# Patient Record
Sex: Female | Born: 1945 | Race: White | Hispanic: No | Smoking: Former smoker
Health system: Southern US, Community
[De-identification: ages and names within clinical notes are randomized; demographics above are authoritative.]

## PROBLEM LIST (undated history)

## (undated) DIAGNOSIS — T8859XA Other complications of anesthesia, initial encounter: Secondary | ICD-10-CM

## (undated) DIAGNOSIS — Z87442 Personal history of urinary calculi: Secondary | ICD-10-CM

## (undated) DIAGNOSIS — G500 Trigeminal neuralgia: Secondary | ICD-10-CM

## (undated) DIAGNOSIS — K219 Gastro-esophageal reflux disease without esophagitis: Secondary | ICD-10-CM

## (undated) DIAGNOSIS — T4145XA Adverse effect of unspecified anesthetic, initial encounter: Secondary | ICD-10-CM

## (undated) DIAGNOSIS — I10 Essential (primary) hypertension: Secondary | ICD-10-CM

## (undated) DIAGNOSIS — J45909 Unspecified asthma, uncomplicated: Secondary | ICD-10-CM

## (undated) DIAGNOSIS — M1990 Unspecified osteoarthritis, unspecified site: Secondary | ICD-10-CM

## (undated) DIAGNOSIS — IMO0001 Reserved for inherently not codable concepts without codable children: Secondary | ICD-10-CM

## (undated) DIAGNOSIS — E119 Type 2 diabetes mellitus without complications: Secondary | ICD-10-CM

## (undated) DIAGNOSIS — F32A Depression, unspecified: Secondary | ICD-10-CM

## (undated) DIAGNOSIS — E785 Hyperlipidemia, unspecified: Secondary | ICD-10-CM

## (undated) DIAGNOSIS — F410 Panic disorder [episodic paroxysmal anxiety] without agoraphobia: Secondary | ICD-10-CM

## (undated) DIAGNOSIS — F329 Major depressive disorder, single episode, unspecified: Secondary | ICD-10-CM

## (undated) HISTORY — DX: Hyperlipidemia, unspecified: E78.5

## (undated) HISTORY — DX: Essential (primary) hypertension: I10

## (undated) HISTORY — PX: TUBAL LIGATION: SHX77

## (undated) HISTORY — PX: APPENDECTOMY: SHX54

## (undated) HISTORY — PX: TOTAL ABDOMINAL HYSTERECTOMY: SHX209

## (undated) HISTORY — PX: BACK SURGERY: SHX140

## (undated) HISTORY — PX: CHOLECYSTECTOMY: SHX55

## (undated) HISTORY — PX: CYSTOSCOPY W/ STONE MANIPULATION: SHX1427

## (undated) HISTORY — DX: Unspecified asthma, uncomplicated: J45.909

---

## 1998-07-04 ENCOUNTER — Emergency Department (HOSPITAL_COMMUNITY): Admission: EM | Admit: 1998-07-04 | Discharge: 1998-07-04 | Payer: Self-pay

## 1998-08-09 ENCOUNTER — Other Ambulatory Visit: Admission: RE | Admit: 1998-08-09 | Discharge: 1998-08-09 | Payer: Self-pay | Admitting: Obstetrics and Gynecology

## 1999-08-03 ENCOUNTER — Other Ambulatory Visit: Admission: RE | Admit: 1999-08-03 | Discharge: 1999-08-03 | Payer: Self-pay | Admitting: Family Medicine

## 1999-12-08 ENCOUNTER — Ambulatory Visit (HOSPITAL_COMMUNITY): Admission: RE | Admit: 1999-12-08 | Discharge: 1999-12-08 | Payer: Self-pay

## 1999-12-25 ENCOUNTER — Encounter: Admission: RE | Admit: 1999-12-25 | Discharge: 1999-12-25 | Payer: Self-pay | Admitting: Family Medicine

## 1999-12-25 ENCOUNTER — Encounter: Payer: Self-pay | Admitting: Family Medicine

## 2001-08-19 ENCOUNTER — Encounter: Admission: RE | Admit: 2001-08-19 | Discharge: 2001-08-19 | Payer: Self-pay | Admitting: Family Medicine

## 2001-08-19 ENCOUNTER — Encounter: Payer: Self-pay | Admitting: Family Medicine

## 2001-08-21 ENCOUNTER — Encounter: Payer: Self-pay | Admitting: Family Medicine

## 2001-08-21 ENCOUNTER — Encounter: Admission: RE | Admit: 2001-08-21 | Discharge: 2001-08-21 | Payer: Self-pay | Admitting: Family Medicine

## 2001-09-01 ENCOUNTER — Encounter (INDEPENDENT_AMBULATORY_CARE_PROVIDER_SITE_OTHER): Payer: Self-pay

## 2001-09-01 ENCOUNTER — Observation Stay (HOSPITAL_COMMUNITY): Admission: RE | Admit: 2001-09-01 | Discharge: 2001-09-02 | Payer: Self-pay

## 2002-08-17 ENCOUNTER — Encounter: Payer: Self-pay | Admitting: Urology

## 2002-08-17 ENCOUNTER — Encounter: Admission: RE | Admit: 2002-08-17 | Discharge: 2002-08-17 | Payer: Self-pay | Admitting: Urology

## 2002-08-19 ENCOUNTER — Ambulatory Visit (HOSPITAL_BASED_OUTPATIENT_CLINIC_OR_DEPARTMENT_OTHER): Admission: RE | Admit: 2002-08-19 | Discharge: 2002-08-19 | Payer: Self-pay | Admitting: Urology

## 2002-08-23 ENCOUNTER — Encounter: Payer: Self-pay | Admitting: Family Medicine

## 2002-08-23 ENCOUNTER — Encounter: Admission: RE | Admit: 2002-08-23 | Discharge: 2002-08-23 | Payer: Self-pay | Admitting: Family Medicine

## 2003-09-05 ENCOUNTER — Ambulatory Visit (HOSPITAL_COMMUNITY): Admission: RE | Admit: 2003-09-05 | Discharge: 2003-09-05 | Payer: Self-pay | Admitting: Family Medicine

## 2003-09-05 ENCOUNTER — Encounter: Payer: Self-pay | Admitting: Family Medicine

## 2003-09-06 ENCOUNTER — Ambulatory Visit (HOSPITAL_COMMUNITY): Admission: RE | Admit: 2003-09-06 | Discharge: 2003-09-06 | Payer: Self-pay | Admitting: Family Medicine

## 2003-09-21 ENCOUNTER — Encounter: Admission: RE | Admit: 2003-09-21 | Discharge: 2003-09-21 | Payer: Self-pay | Admitting: Family Medicine

## 2003-11-07 ENCOUNTER — Ambulatory Visit (HOSPITAL_COMMUNITY): Admission: RE | Admit: 2003-11-07 | Discharge: 2003-11-07 | Payer: Self-pay | Admitting: *Deleted

## 2004-04-17 ENCOUNTER — Other Ambulatory Visit: Admission: RE | Admit: 2004-04-17 | Discharge: 2004-04-17 | Payer: Self-pay | Admitting: *Deleted

## 2004-10-23 ENCOUNTER — Encounter: Admission: RE | Admit: 2004-10-23 | Discharge: 2004-10-23 | Payer: Self-pay | Admitting: Family Medicine

## 2005-09-16 ENCOUNTER — Encounter: Admission: RE | Admit: 2005-09-16 | Discharge: 2005-09-16 | Payer: Self-pay | Admitting: Family Medicine

## 2006-02-07 ENCOUNTER — Encounter: Admission: RE | Admit: 2006-02-07 | Discharge: 2006-02-07 | Payer: Self-pay | Admitting: Orthopedic Surgery

## 2006-10-09 ENCOUNTER — Encounter: Admission: RE | Admit: 2006-10-09 | Discharge: 2006-10-09 | Payer: Self-pay | Admitting: Family Medicine

## 2006-10-17 ENCOUNTER — Encounter: Admission: RE | Admit: 2006-10-17 | Discharge: 2006-10-17 | Payer: Self-pay | Admitting: Family Medicine

## 2007-01-26 ENCOUNTER — Other Ambulatory Visit: Admission: RE | Admit: 2007-01-26 | Discharge: 2007-01-26 | Payer: Self-pay | Admitting: Family Medicine

## 2007-03-17 ENCOUNTER — Ambulatory Visit (HOSPITAL_COMMUNITY): Admission: RE | Admit: 2007-03-17 | Discharge: 2007-03-17 | Payer: Self-pay | Admitting: Interventional Cardiology

## 2007-10-22 ENCOUNTER — Ambulatory Visit (HOSPITAL_COMMUNITY): Admission: RE | Admit: 2007-10-22 | Discharge: 2007-10-22 | Payer: Self-pay | Admitting: Gastroenterology

## 2007-10-22 ENCOUNTER — Encounter (INDEPENDENT_AMBULATORY_CARE_PROVIDER_SITE_OTHER): Payer: Self-pay | Admitting: Gastroenterology

## 2007-11-12 LAB — HM COLONOSCOPY: HM Colonoscopy: NORMAL

## 2007-11-18 ENCOUNTER — Ambulatory Visit (HOSPITAL_COMMUNITY): Admission: RE | Admit: 2007-11-18 | Discharge: 2007-11-18 | Payer: Self-pay | Admitting: Gastroenterology

## 2008-04-21 ENCOUNTER — Encounter: Admission: RE | Admit: 2008-04-21 | Discharge: 2008-04-21 | Payer: Self-pay | Admitting: Family Medicine

## 2009-06-12 ENCOUNTER — Encounter: Admission: RE | Admit: 2009-06-12 | Discharge: 2009-06-12 | Payer: Self-pay | Admitting: Family Medicine

## 2009-09-30 ENCOUNTER — Ambulatory Visit: Payer: Self-pay | Admitting: Family Medicine

## 2009-09-30 DIAGNOSIS — R569 Unspecified convulsions: Secondary | ICD-10-CM

## 2009-09-30 DIAGNOSIS — I10 Essential (primary) hypertension: Secondary | ICD-10-CM | POA: Insufficient documentation

## 2010-02-09 LAB — HM MAMMOGRAPHY: HM Mammogram: NORMAL

## 2010-05-08 ENCOUNTER — Ambulatory Visit: Payer: Self-pay | Admitting: Family Medicine

## 2010-05-08 LAB — CONVERTED CEMR LAB
Glucose, Urine, Semiquant: 100
Nitrite: POSITIVE
Protein, U semiquant: 100
Specific Gravity, Urine: 1.02
Urobilinogen, UA: 4
pH: 5.5

## 2010-05-09 ENCOUNTER — Encounter: Payer: Self-pay | Admitting: Family Medicine

## 2010-05-28 ENCOUNTER — Other Ambulatory Visit: Admission: RE | Admit: 2010-05-28 | Discharge: 2010-05-28 | Payer: Self-pay | Admitting: Family Medicine

## 2010-06-19 ENCOUNTER — Encounter: Admission: RE | Admit: 2010-06-19 | Discharge: 2010-06-19 | Payer: Self-pay | Admitting: Family Medicine

## 2010-07-03 ENCOUNTER — Encounter: Admission: RE | Admit: 2010-07-03 | Discharge: 2010-07-03 | Payer: Self-pay | Admitting: Family Medicine

## 2010-12-01 ENCOUNTER — Encounter: Payer: Self-pay | Admitting: Orthopedic Surgery

## 2010-12-03 ENCOUNTER — Encounter: Payer: Self-pay | Admitting: Family Medicine

## 2010-12-11 NOTE — Assessment & Plan Note (Signed)
Summary: Traci Sullivan   Vital Signs:  Patient Profile:   65 Years Old Female CC:      dysuria, urinary frequency  X 3 days, rm 3 Height:     64.5 inches Weight:      268 pounds O2 Sat:      96 % O2 treatment:    Room Air Temp:     97.1 degrees F oral Pulse rate:   80 / minute Resp:     18 per minute BP sitting:   136 / 76  (right arm) Cuff size:   regular  Pt. in pain?   no  Vitals Entered By: Lajean Saver RN (May 08, 2010 10:37 AM)                   Updated Prior Medication List: CRESTOR 5 MG TABS (ROSUVASTATIN CALCIUM) 1 tab by mouth once daily TEGRETOL 200 MG TABS (CARBAMAZEPINE) 1-3 times daily ACTOS 30 MG TABS (PIOGLITAZONE HCL) 1 tab by mouth once daily COZAAR 25 MG TABS (LOSARTAN POTASSIUM) 1 tab by mouth once daily ESTROPIPATE 1.5 MG TABS (ESTROPIPATE) 1 tab po once daily METFORMIN HCL 500 MG TABS (METFORMIN HCL) 1 tab by mouth two times a day ASPIR-LOW 81 MG TBEC (ASPIRIN) 1 tab by mouth once daily VITAMIN D 1000 UNIT TABS (CHOLECALCIFEROL) 1 tab by mouth once daily VITAMIN B-12 1000 MCG TABS (CYANOCOBALAMIN) 1 tab by mouth once daily FISH OIL 1000 MG CAPS (OMEGA-3 FATTY ACIDS) 1 tab by mouth once daily * FIORICET 1 by mouth q6-8hrs as needed * ALLEGRA- D 24HRS SIG 1 by mouth QDAY AZO-STANDARD 95 MG TABS (PHENAZOPYRIDINE HCL) two times a day since 05-07-10  Current Allergies (reviewed today): ! PENICILLINHistory of Present Illness Chief Complaint: dysuria, urinary frequency  X 3 days, rm 3 History of Present Illness: Subjective:  Patient presents complaining of UTI symptoms for 2 days.  Complains of dysuria, frequency, nocturia, and urgency.  No hematuria.  No abnormal vaginal discharge.  No fever/chills/sweats.  No abdominal pain.  No flank pain.  No nausea/vomiting.   Last UTI about a year ago.  Usually responds well to Cipro.  REVIEW OF SYSTEMS Constitutional Symptoms      Denies fever, chills, night sweats, weight loss, weight gain, and fatigue.    Eyes       Denies change in vision, eye pain, eye discharge, glasses, contact lenses, and eye surgery. Ear/Nose/Throat/Mouth       Denies hearing loss/aids, change in hearing, ear pain, ear discharge, dizziness, frequent runny nose, frequent nose bleeds, sinus problems, sore throat, hoarseness, and tooth pain or bleeding.  Respiratory       Denies dry cough, productive cough, wheezing, shortness of breath, asthma, bronchitis, and emphysema/COPD.  Cardiovascular       Denies murmurs, chest pain, and tires easily with exhertion.    Gastrointestinal       Denies stomach pain, nausea/vomiting, diarrhea, constipation, blood in bowel movements, and indigestion. Genitourniary       Complains of painful urination.      Denies kidney stones and loss of urinary control.      Comments: urinary frequency Neurological       Denies paralysis, seizures, and fainting/blackouts. Musculoskeletal       Denies muscle pain, joint pain, joint stiffness, decreased range of motion, redness, swelling, muscle weakness, and gout.  Skin       Denies bruising, unusual mles/lumps or sores, and hair/skin or nail changes.  Psych  Denies mood changes, temper/anger issues, anxiety/stress, speech problems, depression, and sleep problems.  Past History:  Past Medical History: Reviewed history from 09/30/2009 and no changes required. Diabetes mellitus, type I Hyperlipidemia Hypertension Seizure disorder  Past Surgical History: Reviewed history from 09/30/2009 and no changes required. Appendectomy Caesarean section Cholecystectomy Tubal ligation  Family History: Reviewed history from 09/30/2009 and no changes required. Family History Diabetes 1st degree relative Family History Hypertension Family History of Prostate CA 1st degree relative <50  Social History: Reviewed history from 09/30/2009 and no changes required. Married Never Smoked Alcohol use-no Drug use-no Regular  exercise-no   Objective:  Appearance:   Obese middle-aged female alert and in no acute distress  Eyes:  Pupils are equal, round, and reactive to light and accomdation.  Extraocular movement is intact.  Conjunctivae are not inflamed.  Mouth:  moist mucous membranes  Neck:  Supple.  No adenopathy is present.   Lungs:  Clear to auscultation.  Breath sounds are equal.  Heart:  Regular rate and rhythm without murmurs, rubs, or gallops.  Abdomen:  Nontender without masses or hepatosplenomegaly.  Bowel sounds are present.  No CVA or flank tenderness.  urinalysis (dipstick):  large blood, positive nitrite, large leuks Assessment New Problems: ACUTE CYSTITIS (ICD-595.0)   Plan New Medications/Changes: CIPROFLOXACIN HCL 250 MG TABS (CIPROFLOXACIN HCL) 1 po two times a day  #14 x 0, 05/08/2010, Donna Christen MD  New Orders: T-Culture, Urine [[id number]] Est. Patient Level III [16010] Urinalysis [81003-65000] T-Culture, Urine [[id number]] Planning Comments:   Urine culture pending. Begin Cipro for 5 to 7 days.  Increase fluids. Follow-up with PCP if not improving.   The patient and/or caregiver has been counseled thoroughly with regard to medications prescribed including dosage, schedule, interactions, rationale for use, and Traci side effects and they verbalize understanding.  Diagnoses and expected course of recovery discussed and will return if not improved as expected or if the condition worsens. Patient and/or caregiver verbalized understanding.  Prescriptions: CIPROFLOXACIN HCL 250 MG TABS (CIPROFLOXACIN HCL) 1 po two times a day  #14 x 0   Entered and Authorized by:   Donna Christen MD   Signed by:   Donna Christen MD on 05/08/2010   Method used:   Print then Give to Patient   RxID:   [id number]   Orders Added: 1)  T-Culture, Urine [[id number]] 2)  Est. Patient Level III [10626] 3)  Urinalysis [81003-65000] 4)  T-Culture, Urine [[id number]]  Laboratory Results    Urine Tests  Date/Time Received: May 08, 2010 10:52 AM  Date/Time Reported: May 08, 2010 10:52 AM   Routine Urinalysis   Color: orange Appearance: slightly cloudy Glucose: 100   (Normal Range: Negative) Bilirubin: moderate   (Normal Range: Negative) Ketone: small (15)   (Normal Range: Negative) Spec. Gravity: 1.020   (Normal Range: 1.003-1.035) Blood: large   (Normal Range: Negative) pH: 5.5   (Normal Range: 5.0-8.0) Protein: 100   (Normal Range: Negative) Urobilinogen: 4.0   (Normal Range: 0-1) Nitrite: positive   (Normal Range: Negative) Leukocyte Esterace: large   (Normal Range: Negative)

## 2011-01-18 ENCOUNTER — Ambulatory Visit (INDEPENDENT_AMBULATORY_CARE_PROVIDER_SITE_OTHER): Payer: Medicare Other | Admitting: Family Medicine

## 2011-01-18 ENCOUNTER — Encounter: Payer: Self-pay | Admitting: Family Medicine

## 2011-01-18 DIAGNOSIS — E119 Type 2 diabetes mellitus without complications: Secondary | ICD-10-CM | POA: Insufficient documentation

## 2011-01-18 DIAGNOSIS — R5381 Other malaise: Secondary | ICD-10-CM

## 2011-01-18 DIAGNOSIS — N951 Menopausal and female climacteric states: Secondary | ICD-10-CM

## 2011-01-18 DIAGNOSIS — N760 Acute vaginitis: Secondary | ICD-10-CM | POA: Insufficient documentation

## 2011-01-18 DIAGNOSIS — R5383 Other fatigue: Secondary | ICD-10-CM

## 2011-01-21 ENCOUNTER — Encounter: Payer: Self-pay | Admitting: Family Medicine

## 2011-01-21 LAB — CONVERTED CEMR LAB
Clue Cells Wet Prep HPF POC: NONE SEEN
Trich, Wet Prep: NONE SEEN
WBC, Wet Prep HPF POC: NONE SEEN
Yeast Wet Prep HPF POC: NONE SEEN

## 2011-01-22 NOTE — Assessment & Plan Note (Addendum)
Summary: NOV: Get estab    Vital Signs:  Patient profile:   65 year old female Height:      64.5 inches Weight:      276 pounds BMI:     46.81 O2 Sat:      72 % on Room air Pulse rate:   72 / minute BP sitting:   131 / 71  (left arm) Cuff size:   large  Vitals Entered By: Payton Spark CMA (January 18, 2011 9:11 AM)  O2 Flow:  Room air CC: New to est.    CC:  New to est. .  History of Present Illness: Havng a vagainal odor but no discharge.  No dysuria.   Habits & Providers  Alcohol-Tobacco-Diet     Tobacco Status: quit  Current Medications (verified): 1)  Crestor 5 Mg Tabs (Rosuvastatin Calcium) .Marland Kitchen.. 1 Tab By Mouth Once Daily 2)  Tegretol 200 Mg Tabs (Carbamazepine) .Marland Kitchen.. 1-3 Times Daily 3)  Actos 30 Mg Tabs (Pioglitazone Hcl) .Marland Kitchen.. 1 Tab By Mouth Once Daily 4)  Cozaar 25 Mg Tabs (Losartan Potassium) .Marland Kitchen.. 1 Tab By Mouth Once Daily 5)  Estropipate 1.5 Mg Tabs (Estropipate) .Marland Kitchen.. 1 Tab Po Once Daily 6)  Aspir-Low 81 Mg Tbec (Aspirin) .Marland Kitchen.. 1 Tab By Mouth Once Daily 7)  Vitamin D 1000 Unit Tabs (Cholecalciferol) .Marland Kitchen.. 1 Tab By Mouth Once Daily 8)  Vitamin B-12 1000 Mcg Tabs (Cyanocobalamin) .Marland Kitchen.. 1 Tab By Mouth Once Daily 9)  Fish Oil 1000 Mg Caps (Omega-3 Fatty Acids) .Marland Kitchen.. 1 Tab By Mouth Once Daily 10)  Miralax  Powd (Polyethylene Glycol 3350) .... Take As Directed 3 Times Weekly  Allergies (verified): 1)  ! Penicillin  Past History:  Past Surgical History: Appendectomy Caesarean section Cholecystectomy Tubal ligation Complete Hysterectomy kidney stones removed.   Family History: Family History Diabetes 1st degree relative Family History Hypertension Family History of Prostate CA 1st degree relative <50  Younger Sister the BreastCancer(dx in 82s),  and older sister dx colon Cancer at age 30, DM, hi chol Mom with MI, HTN Father with stroke, kidney dz Aunts with stroke.  GF with MI  Social History: Married to [place] with 2 adult kids. Retiredx.  HS degree.    Alcohol use-no Drug use-no Regular exercise-no Former Smoker Smoking Status:  quit  Review of Systems       + fever/sweats/weakness, unexplained weight loss/gain.  No vison changes.  No difficulty hearing/ringing in ears, hay fever/allergies. +chest pain/discomfort, no palpitations.  No Br lump/nipple discharge.  No cough/wheeze.  + blood in BM, no nausea/vomiting/diarrhea.  + nighttime urination, no leaking urine, unusual vaginal bleeding, discharge (penis or vagina).  + muscle/joint pain. + rash, change in mole.  No HA, memory loss.  No anxiety, sleep d/o, depression.  + easy bruising/bleeding, no unexplained lump   Physical Exam  General:  Well-developed,well-nourished,in no acute distress; alert,appropriate and cooperative throughout examination Neck:  No deformities, masses, or tenderness noted. NO Tm.  Lungs:  Normal respiratory effort, chest expands symmetrically. Lungs are clear to auscultation, no crackles or wheezes. Heart:  Normal rate and regular rhythm. S1 and S2 normal without gallop, murmur, click, rub or other extra sounds. Skin:  no rashes.   Psych:  Cognition and judgment appear intact. Alert and cooperative with normal attention span and concentration. No apparent delusions, illusions, hallucinations   Impression & Recommendations:  Problem # 1:  DIABETES MELLITUS, CONTROLLED (ICD-250.00) Will d/c Actos and change to onglyza. Will f/u in one month  to see how she is doing on this.  I discussed the potential risk with the Actos.  The following medications were removed from the medication list:    Metformin Hcl 500 Mg Tabs (Metformin hcl) .Marland Kitchen... 1 tab by mouth two times a day Her updated medication list for this problem includes:    Onglyza 5 Mg Tabs (Saxagliptin hcl) .Marland Kitchen... Take 1 tablet by mouth once a day    Cozaar 25 Mg Tabs (Losartan potassium) .Marland Kitchen... 1 tab by mouth once daily    Aspir-low 81 Mg Tbec (Aspirin) .Marland Kitchen... 1 tab by mouth once daily  Problem # 2:  SEIZURE  DISORDER (ICD-780.39) Well controlled. Allowed to drive.  Her updated medication list for this problem includes:    Tegretol 200 Mg Tabs (Carbamazepine) .Marland Kitchen... 1-3 times daily  Problem # 3:  MENOPAUSAL SYNDROME (ICD-627.2) Try to wean off yearly.Has been on it for about 5 years.   Her updated medication list for this problem includes:    Estropipate 1.5 Mg Tabs (Estropipate) .Marland Kitchen... 1 tab po once daily  Problem # 4:  FATIGUE (ICD-780.79) New onset fatigue the last coupel of months. Will check blood coutn and thyroid. WIll also recheck iron. Hx of iron def anemia.  Orders: T-CBC w/Diff ([id number]) T-TSH [phone]) Augusto Gamble ([id number])  Complete Medication List: 1)  Crestor 5 Mg Tabs (Rosuvastatin calcium) .Marland Kitchen.. 1 tab by mouth once daily 2)  Tegretol 200 Mg Tabs (Carbamazepine) .Marland Kitchen.. 1-3 times daily 3)  Onglyza 5 Mg Tabs (Saxagliptin hcl) .... Take 1 tablet by mouth once a day 4)  Cozaar 25 Mg Tabs (Losartan potassium) .Marland Kitchen.. 1 tab by mouth once daily 5)  Estropipate 1.5 Mg Tabs (Estropipate) .Marland Kitchen.. 1 tab po once daily 6)  Aspir-low 81 Mg Tbec (Aspirin) .Marland Kitchen.. 1 tab by mouth once daily 7)  Vitamin D 1000 Unit Tabs (Cholecalciferol) .Marland Kitchen.. 1 tab by mouth once daily 8)  Vitamin B-12 1000 Mcg Tabs (Cyanocobalamin) .Marland Kitchen.. 1 tab by mouth once daily 9)  Fish Oil 1000 Mg Caps (Omega-3 fatty acids) .Marland Kitchen.. 1 tab by mouth once daily 10)  Miralax Powd (Polyethylene glycol 3350) .... Take as directed 3 times weekly  Patient Instructions: 1)  Remember to get your eye exam.  2)  Please schedule a follow-up appointment in 1 month for diabetes. Marland Kitchen  3)  We will call you with your lab results.  Prescriptions: ONGLYZA 5 MG TABS (SAXAGLIPTIN HCL) Take 1 tablet by mouth once a day  #30 x 2   Entered and Authorized by:   Nani Gasser MD   Signed by:   Nani Gasser MD on 01/18/2011   Method used:   Electronically to        [facility]* (retail)       [address] [place], [place]   [address]       Ph: [phone]       Fax: [phone]   RxID:   [id number]    Orders Added: 1)  T-CBC w/Diff [[id number]] 2)  T-TSH [[id number]] 3)  T-Iron [32992-42683] 4)  New Patient Level III [41962]     Preventive Care Screening  Bone Density:    Date:  09/11/2010    Next Due:  09/2012    Results:  abnormal std dev  Pap Smear:    Next Due:  01/2012  Mammogram:    Date:  02/09/2010    Next Due:  02/2011    Results:  normal  Colonoscopy:    Date:  11/12/2007    Next Due:  11/2012    Results:  normal    Prevention & Chronic Care Immunizations   Influenza vaccine: Not documented    Tetanus booster: Not documented    Pneumococcal vaccine: Not documented    H. zoster vaccine: Not documented  Colorectal Screening   Hemoccult: Not documented    Colonoscopy: normal  (11/12/2007)   Colonoscopy due: 11/2012  Other Screening   Pap smear: Not documented   Pap smear due: 01/2012    Mammogram: normal  (02/09/2010)   Mammogram due: 02/2011    DXA bone density scan: abnormal  (09/11/2010)   DXA scan due: 09/2012    Smoking status: quit  (01/18/2011)  Diabetes Mellitus   HgbA1C: Not documented    Eye exam: Not documented    Foot exam: Not documented   High risk foot: Not documented   Foot care education: Not documented    Urine microalbumin/creatinine ratio: Not documented  Lipids   Total Cholesterol: Not documented   LDL: Not documented   LDL Direct: Not documented   HDL: Not documented   Triglycerides: Not documented  Hypertension   Last Blood Pressure: 131 / 71  (01/18/2011)   Serum creatinine: Not documented   Serum potassium Not documented  Self-Management Support :    Diabetes self-management support: Not documented    Hypertension self-management support: Not documented    Appended Document: NOV: Get estab  Will send wet prep for vaginitis.  Vaginal irritation and odor. No discharge.   Appended Document: NOV: Get estab   Call pt: She can stop her fosamax and take a "HOliday" from it for about 5 years but we need to make sure we are getting her bone density tests regularly to make sure not a significant decline in bone density once of the medication. There are not clear cut guidelines on this but this is some data to suggest that once on it for 5 + years it is protective for a few years after come off the medication. March  9, 20125:09 PM Caroleann Casler MD, Santina Evans   .called pt and pt states she is not on fosamax

## 2011-01-29 NOTE — Letter (Signed)
Summary: Intake Forms  Intake Forms   Imported By: Lanelle Bal 01/24/2011 10:43:26  _____________________________________________________________________  External Attachment:    Type:   Image     Comment:   External Document

## 2011-02-05 ENCOUNTER — Telehealth: Payer: Self-pay | Admitting: Family Medicine

## 2011-02-05 NOTE — Telephone Encounter (Signed)
Yes, I can lance the cyst.

## 2011-02-06 NOTE — Telephone Encounter (Signed)
Pt has been notified that this can be done

## 2011-02-12 ENCOUNTER — Encounter: Payer: Self-pay | Admitting: Family Medicine

## 2011-02-18 ENCOUNTER — Ambulatory Visit: Payer: Medicare Other | Admitting: Family Medicine

## 2011-03-26 NOTE — Op Note (Signed)
Traci Sullivan, Traci Sullivan                ACCOUNT NO.:  [id number]   MEDICAL RECORD NO.:  [id number]          PATIENT TYPE:  AMB   LOCATION:  ENDO                         [facility]AC[facility]   PHYSICIAN:  Shirley Friar, MDDATE OF BIRTH:  February 09, 1946   DATE OF PROCEDURE:  DATE OF DISCHARGE:                               OPERATIVE REPORT   INDICATIONS:  Iron-deficiency anemia.   MEDICATIONS:  See anesthesia sheet (total is given preceding  colonoscopy).   FINDINGS:  The endoscope was inserted through the oropharynx, and  esophagus was intubated.  The esophagus was normal in its entirety.  The  endoscope was advanced to the stomach, which revealed two small  superficial ulcerations in the pre-pyloric channel with surrounding  edema.  Mild linear erythematous streaks were noted in the antrum,  esophageal erosions.  Two biopsies were taken in the region of these  ulcerations.  The remaining portion of the stomach and mucosa were  normal in appearance.  The endoscope was advanced into the duodenal bulb  and second portion of the duodenum, which were normal endoscopically.  Biopsies were taken in the second portion of the duodenum.   ASSESSMENT:  1. Gastric ulcerations.  2. Gastric erosions.  3. Otherwise normal esophagogastroduodenoscopy.   PLAN:  Follow up on pathology.      Shirley Friar, MD  Electronically Signed     VCS/MEDQ  D:  10/22/2007  T:  10/22/2007  Job:  [id number]   cc:   Carma Leaven, DO  Fax: [phone]

## 2011-03-26 NOTE — Op Note (Signed)
Traci Sullivan, Traci Sullivan                ACCOUNT NO.:  [id number]   MEDICAL RECORD NO.:  [id number]          PATIENT TYPE:  AMB   LOCATION:  ENDO                         FACILITY:  [facility]   PHYSICIAN:  Shirley Friar, MDDATE OF BIRTH:  1946/07/06   DATE OF PROCEDURE:  10/22/2007  DATE OF DISCHARGE:                               OPERATIVE REPORT   PROCEDURE:  Colonoscopy.   INDICATION:  Iron-deficiency anemia.   MEDICATIONS:  Fentanyl 50 mcg IV, Versed 2 mg the IV, propofol infusion  per anesthesia (total of 500 mg IV given for both procedures combined).   FINDINGS:  Rectal exam was normal.  A pediatric Pentax colonoscope was  inserted into a adequately prepped colon and advanced to the cecum.  In  order reach the cecum, the patient had to be turned in supine position  and abdominal pressure had to be applied.  With repeated loop reduction  and abdominal pressure and turning back in left lateral position the  endoscope was able to intubate the cecum.  Appendiceal orifice and the  ileocecal valve were identified.  On careful withdrawal of the  colonoscope no mucosal abnormalities were seen.  Retroflexion revealed a  hypertrophied anal papillae but otherwise unremarkable retroflexion.   ASSESSMENT:  Normal colonoscopy.   PLAN:  1. Proceed with upper endoscopy.  2. Repeat colonoscopy in 5 years due to family history of colon      cancer.      Shirley Friar, MD  Electronically Signed     VCS/MEDQ  D:  10/22/2007  T:  10/23/2007  Job:  [id number]   cc:   Carma Leaven, DO  Fax: [phone]

## 2011-03-26 NOTE — Op Note (Signed)
Traci Sullivan, Traci Sullivan                ACCOUNT NO.:  [id number]   MEDICAL RECORD NO.:  [id number]         PATIENT TYPE:  LAMB   LOCATION:                               FACILITY:  ENDO   PHYSICIAN:  Shirley Friar, MDDATE OF BIRTH:  Mar 14, 1946   DATE OF PROCEDURE:  11/18/2007  DATE OF DISCHARGE:                               OPERATIVE REPORT   PROCEDURE:  Capsule endoscopy.   INDICATIONS:  Iron-deficiency anemia.   FINDINGS:  First gastric image was noted at 27 seconds.  First duodenal  image was noted at 2 hours 57 minutes 2 seconds.  Small bowel transit  time was 3 hours 56 minutes.   A few non-bleeding small bowel erosions were seen.  No other  abnormalities were noted.   SUMMARY AND RECOMMENDATIONS:  1. A few small bowel erosions that I doubt are the source of her      severe iron-deficiency anemia.  2. Continue iron supplementation.  3. Hematology consult.      Shirley Friar, MD  Electronically Signed     VCS/MEDQ  D:  11/25/2007  T:  11/26/2007  Job:  [id number]   cc:   Sigmund Hazel, M.D.  Fax: [phone]

## 2011-03-29 NOTE — Op Note (Signed)
NAME:  Traci Sullivan, Traci Sullivan                          ACCOUNT NO.:  [id number]   MEDICAL RECORD NO.:  [id number]                   PATIENT TYPE:  AMB   LOCATION:  [facility]                                 FACILITY:  [facility]   PHYSICIAN:  Excell Seltzer. Annabell Howells, M.D.                 DATE OF BIRTH:  January 28, 1946   DATE OF PROCEDURE:  08/19/2002  DATE OF DISCHARGE:                                 OPERATIVE REPORT   PROCEDURE:  Cystoscopy, left retrograde pyelogram with interpretation, left  ureteroscopic stone extraction, insertion of left double J stent.   PREOPERATIVE DIAGNOSES:  Left UPJ stone.   POSTOPERATIVE DIAGNOSES:  Left UPJ stone.   SURGEON:  Excell Seltzer. Annabell Howells, M.D.   ANESTHESIA:  General.   DRAINS:  6 French 24 cm double J stent.   SPECIMEN:  Stone.   COMPLICATIONS:  None.   INDICATIONS FOR PROCEDURE:  Traci Sullivan is a 65 year old white female with a  six week history of hematuria and remote flank pain who was found on CT to  have a 2 x 5 mm left UPJ stone. It was difficult to see this stone on a  plain x-ray, so it was felt that ureteroscopy was the best treatment  approach.   FINDINGS AND PROCEDURE:  The patient had been on antibiotics preoperatively  and was given Tequin this morning. She was taken to the operating room where  a general anesthetic was induced. She was placed in the lithotomy position,  her perineum and genitalia were prepped with Betadine solution and she was  draped in the usual sterile fashion. Cystoscopy was performed using the 22  [place] scope and 12 and 70 degree lenses. Examination revealed a normal  urethra, the bladder wall was smooth and pale without tumor, stones or  inflammation, the ureteral orifices were unremarkable. The left ureteral  orifice was cannulated with a 5 [place] open end catheter, contrast was  instilled in a retrograde fashion. This study demonstrated an unremarkable  ureter to the level of the UPJ where there was a filling defect consistent  with a stone seen on CT scan. There was mild fullness of the collecting  system proximal to this stone. Initially an attempt was made to basket the  stone out using a __________ [name]; however, the stone was not  retrieved with two passes of the basket. I then passed a 6 [place] short  ureteroscope up to the level of the UVJ and no longer saw the stone and felt  it was likely bumped back into the kidney. I then passed a guidewire through  the 6 [place] short ureteral scope, remove the short ureteroscope and then  placed a 6 French flexible ureteroscope over the wire to the kidney.  Inspection demonstrated the stone had dislodged into the lower pole of the  kidney where it was engaged with a nitinol basket and then removed without  difficulty.  After removal of the stone, the cystoscope was replaced, a  guidewire was inserted into the kidney and a 6 French 24 cm double J stent  was placed without difficulty over the wire to the kidney under fluoroscopic  guidance. On removal of the wire, a good coil was noted in the kidney and  the bladder. The bladder was then drained, the cystoscope was removed and  the stent string was left exiting the urethra. The position of the stent was  confirmed with fluoroscopy. The stent string was tied short and trimmed. The  patient was taken down from lithotomy position, her anesthetic was reversed  and she was removed to the recovery room in stable condition. There were no  complications.                                               Excell Seltzer. Annabell Howells, M.D.    JJW/MEDQ  D:  08/19/2002  T:  08/19/2002  Job:  [id number]   cc:   Clovis Riley, M.D.

## 2011-03-29 NOTE — Cardiovascular Report (Signed)
NAMELARONDA, Traci Sullivan                ACCOUNT NO.:  [id number]   MEDICAL RECORD NO.:  [id number]          PATIENT TYPE:  OIB   LOCATION:  2899                         FACILITY:  [facility]   PHYSICIAN:  Corky Crafts, MDDATE OF BIRTH:  11/03/1946   DATE OF PROCEDURE:  DATE OF DISCHARGE:  03/17/2007                            CARDIAC CATHETERIZATION   PROCEDURES PERFORMED:  Left heart catheterization, left ventriculogram,  coronary angiogram, abdominal aortogram.   OPERATOR:  Dr. Eldridge Dace.   INDICATIONS:  Abnormal stress test and chest pain.   PROCEDURAL NARRATIVE:  The risks and benefits of cardiac catheterization  were explained to the patient, and informed consent was obtained.  The  patient was brought to the cath lab.  She was prepped and draped in the  usual sterile fashion.  Her right groin was infiltrated with 1%  lidocaine.  A 6-French arterial sheath was attempted to be placed in the  right femoral artery, using a standard needle.  There was difficulty in  accessing the right femoral artery.  I did find the right femoral vein  and placed a 4-French sheath, using the modified Seldinger technique, to  use this as a landmark.  After several attempts, a Smart needle was then  inserted into the right groin, and on the first stick with this longer  needle, the right femoral artery was accessed.  A 6-French arterial  sheath was placed in the right femoral artery using the modified  Seldinger technique.  Left coronary artery angiography was performed,  using a JL-4.0 catheter.  The catheter was advanced to the vessel ostium  under fluoroscopic guidance.  Digital angiography was performed in  multiple projections using hand injection of contrast.  The right  coronary artery angiography was then performed using a JR-4.0 catheter.  The catheter was advanced to the vessel ostium under fluoroscopic  guidance.  Digital angiography was performed in multiple projections  using hand  injection of contrast.  A pigtail catheter was then advanced  to the ascending aorta and across the aortic valve, under fluoroscopic  guidance.  Power injection of contrast was done in the RAO projection,  to visualize the left ventricle.  The catheter was pulled back under  continuous hemodynamic pressure monitoring.  The catheter was withdrawn  to the level of the abdominal aorta, and a power injection of contrast  was performed to visualize the renal artery.  Both sheaths will be  removed, using manual compression.  A right femoral artery angiogram was  performed.  There was no evidence of vessel trauma.   FINDINGS:  The left main is widely patent.  The left circumflex is a  large vessel proximally.  The OM-1 is a large vessel with minor  irregularities.  The left anterior descending is a large vessel with  mild irregularities.  The first, second and third diagonals were all  small vessels.  The right coronary artery is a large dominant vessel  with minor irregularities.  The left ventriculogram shows normal left  ventricular function with ejection fraction of 55-60%.  There was no  mitral regurgitation.   HEMODYNAMIC  RESULTS:  Left ventricular pressure 133/11 with an LVEDP of  20 mmHg.  Aortic pressure 130/68 with a mean aortic pressure of 95 mmHg.  Abdominal aortogram shows a single left renal artery.  There is dual  arterial supply to the right kidney.  All of these vessels are widely  patent.   IMPRESSION:  1. No significant coronary artery disease.  2. Likely noncardiac chest pain.  3. Normal ventricular function.  4. No renal artery stenosis.  5. Moderate mildly increased ventricular end-diastolic pressure.   RECOMMENDATIONS:  The patient will continue with aggressive risk factor  modification.  She will have bedrest for now, and then be discharged  later tonight from the hospital.  I will follow up with her in the  office.      Corky Crafts, MD   Electronically Signed     JSV/MEDQ  D:  03/17/2007  T:  03/17/2007  Job:  [id number]

## 2011-03-29 NOTE — Op Note (Signed)
[facility]  Patient:    Traci Sullivan, Traci Sullivan Visit Number: [id number] MRN: [id number]          Service Type: SUR Location: 3W [id number] [id number] Attending Physician:  Meredith Leeds Dictated by:   Zigmund Daniel, M.D. Proc. Date: 09/01/01 Admit Date:  09/01/2001   CC:         Dr. Clovis Riley, [facility]                           Operative Report  PREOPERATIVE DIAGNOSIS:  Cholelithiasis with chronic cholecystitis.  POSTOPERATIVE DIAGNOSIS:  Cholelithiasis with chronic cholecystitis.  OPERATION:  Laparoscopic cholecystectomy.  SURGEON:  Zigmund Daniel, M.D.  ASSISTANT:  Gita Kudo, M.D.  ANESTHESIA:  General.  DESCRIPTION OF PROCEDURE:  After the patient had adequate monitoring, general anesthesia, and routine preparation and draping of the abdomen, I made a 2 cm midline incision at the upper portion of the lower midline incision, and dissected down through the scar to the fascia which I opened longitudinally, and then entered the peritoneum bluntly.  I was able to put my finger in and sweep away adhesions in the immediate local area, but I could tell that there were omental adhesions to the lower midline wound.  I placed an 0 Vicryl pursestring suture in the fascia, put in a Hasson cannula, and inflated the abdomen with CO2.  Laparoscopy was then done, and I was able to get pass the adhesions without taking them down, and saw that there was no evident injury of the bowel in that region.  I had a good view of the right upper quadrant. I then put in three additional ports after anesthetizing the incision sites, and placed the patient head up, foot down, and left tilted position, and then grasped the fundus of the gallbladder, and elevated it toward the right shoulder.  There were adhesions of omentum to the under surface of the gallbladder, and I took those down bluntly, and with cautery.  I then grasped the infundibulum of the  gallbladder, and retracted it laterally, and dissected into the hepatoduodenal ligament until I clearly identified the cystic duct and the cystic artery.  I checked very carefully to see that the cystic duct was emerging from the infundibulum.  Then, I clipped it with four clips, and cut between the two which were closest to the gallbladder.  I then similarly clipped and divided the cystic artery and found one additional small cystic artery posteriorly, and clipped and divided it as well.  I then removed the gallbladder from the liver, dissecting with the hook and spatula cautery, and detached it intact from the liver.  I gained hemostasis in the gallbladder fossa with spatula cautery and was excellent.  There was no evidence of leakage of bile and the clips were all secure.  I then irrigated the right upper quadrant and removed the irrigant.  After detaching the gallbladder from the liver, I removed it from the body intact through the umbilical incision and then tied the pursestring suture.  The fascia had a slight gap, and so I put in one additional 0 Vicryl suture, and I felt that was adequately closed.  I removed the lateral ports under direct vision, then released the CO2, and removed the epigastric port.  I closed the skin wall incisions with intracuticular 4-0 Vicryl and Steri-Strips.  The patient tolerated the operation well. Dictated by:   Zigmund Daniel,  M.D. Attending Physician:  Meredith Leeds DD:  09/01/01 TD:  09/02/01 Job: [id number]

## 2011-07-05 ENCOUNTER — Other Ambulatory Visit: Payer: Self-pay | Admitting: Family Medicine

## 2011-07-05 DIAGNOSIS — Z1231 Encounter for screening mammogram for malignant neoplasm of breast: Secondary | ICD-10-CM

## 2011-07-23 ENCOUNTER — Ambulatory Visit
Admission: RE | Admit: 2011-07-23 | Discharge: 2011-07-23 | Disposition: A | Discharge status: Home / Self Care | Payer: Medicare Other | Source: Ambulatory Visit | Attending: Family Medicine | Admitting: Family Medicine

## 2011-07-23 DIAGNOSIS — Z1231 Encounter for screening mammogram for malignant neoplasm of breast: Secondary | ICD-10-CM

## 2011-08-19 LAB — BASIC METABOLIC PANEL
BUN: 5 — ABNORMAL LOW
CO2: 27
Calcium: 9.2
Chloride: 104
Creatinine, Ser: 0.6
GFR calc Af Amer: >60
GFR calc non Af Amer: >60
Glucose, Bld: 124 — ABNORMAL HIGH
Potassium: 4
Sodium: 139

## 2011-08-19 LAB — CBC
HCT: 35 — ABNORMAL LOW
Hemoglobin: 12.1
MCV: 87.8
Platelets: 309
RDW: 13.8

## 2012-08-03 ENCOUNTER — Other Ambulatory Visit: Payer: Self-pay | Admitting: Family Medicine

## 2012-08-03 DIAGNOSIS — Z1231 Encounter for screening mammogram for malignant neoplasm of breast: Secondary | ICD-10-CM

## 2012-08-04 ENCOUNTER — Ambulatory Visit (INDEPENDENT_AMBULATORY_CARE_PROVIDER_SITE_OTHER): Payer: Medicare Other

## 2012-08-04 DIAGNOSIS — Z1231 Encounter for screening mammogram for malignant neoplasm of breast: Secondary | ICD-10-CM

## 2012-09-25 ENCOUNTER — Emergency Department (INDEPENDENT_AMBULATORY_CARE_PROVIDER_SITE_OTHER)
Admission: EM | Admit: 2012-09-25 | Discharge: 2012-09-25 | Disposition: A | Discharge status: Home / Self Care | Payer: Medicare Other | Source: Home / Self Care

## 2012-09-25 ENCOUNTER — Encounter: Payer: Self-pay | Admitting: Emergency Medicine

## 2012-09-25 DIAGNOSIS — R300 Dysuria: Secondary | ICD-10-CM

## 2012-09-25 DIAGNOSIS — B373 Candidiasis of vulva and vagina: Secondary | ICD-10-CM

## 2012-09-25 LAB — POCT CBC W AUTO DIFF (K'VILLE URGENT CARE)

## 2012-09-25 LAB — POCT URINALYSIS DIP (MANUAL ENTRY)
Blood, UA: NEGATIVE
Glucose, UA: NEGATIVE
Nitrite, UA: NEGATIVE
Protein Ur, POC: NEGATIVE
Spec Grav, UA: 1.02 (ref 1.005–1.03)
Urobilinogen, UA: 0.2 (ref 0–1)

## 2012-09-25 MED ORDER — CIPROFLOXACIN HCL 500 MG PO TABS: 500.0000 mg | ORAL_TABLET | Freq: Two times a day (BID) | ORAL | Status: AC

## 2012-09-25 MED ORDER — FLUCONAZOLE 150 MG PO TABS: 150.0000 mg | ORAL_TABLET | Freq: Once | ORAL | Status: DC

## 2012-09-25 NOTE — ED Notes (Signed)
Reports frequency of urination x 3 days; some dysuria and back discomfort. Currently has 'yeast infx' which she has been treating with "cream". No other otcs this a.m. Concerned about 25 pound weight gain over past year; discussed measures for increasing exercise, etc.

## 2012-09-25 NOTE — ED Provider Notes (Signed)
History     CSN: [id number]  Arrival date & time 09/25/12  7829   First MD Initiated Contact with Patient 09/25/12 [phone]      No chief complaint on file.   HPI DYSURIA Onset:  3-4 days  Description: increased urinary frequency, back pain Modifying factors: baseline diabetic. On oral meds. Last A1C 6 per pt.   Symptoms Urgency:  yes Frequency: yes  Hesitancy:  yes Hematuria:  no Flank Pain:  yes Fever: chills Nausea/Vomiting:  nausea Missed LMP: no STD exposure: no Discharge: no Irritants: no Rash: no; though pt states that she has had some sxs concerning for yeast infection over this same time frame.   Red Flags   More than 3 UTI's last 12 months:  no PMH of  Diabetes or Immunosuppression:  yes Renal Disease/Calculi: no Urinary Tract Abnormality:  no Instrumentation or Trauma: no    Past Medical History  Diagnosis Date  . Diabetes mellitus     type 1  . Hyperlipidemia   . Hypertension   . Seizures     disorder    Past Surgical History  Procedure Date  . Appendectomy   . Cesarean section   . Cholecystectomy   . Tubal ligation   . Abdominal hysterectomy     complete  . Kidney stones removed     Family History  Problem Relation Age of Onset  . Hypertension Mother   . Heart attack Mother   . Stroke Father   . Kidney disease Father   . Cancer Sister     breast- in 82's  . Diabetes Other     family hx of  . Hypertension Other     family hx of  . Cancer Other     prostate/ family hx of  . Cancer Sister 39    colon  . Stroke Other   . Heart attack Other     History  Substance Use Topics  . Smoking status: Former Games developer  . Smokeless tobacco: Not on file  . Alcohol Use: No    OB History    Grav Para Term Preterm Abortions TAB SAB Ect Mult Living                  Review of Systems  All other systems reviewed and are negative.    Allergies  Penicillins  Home Medications   Current Outpatient Rx  Name  Route  Sig  Dispense   Refill  . ASPIRIN 81 MG PO TABS   Oral   Take 81 mg by mouth daily.           Marland Kitchen CARBAMAZEPINE 200 MG PO TABS   Oral   Take 200 mg by mouth 3 (three) times daily.           Marland Kitchen VITAMIN D 1000 UNITS PO TABS   Oral   Take 1,000 Units by mouth daily.           Marland Kitchen CIPROFLOXACIN HCL 500 MG PO TABS   Oral   Take 1 tablet (500 mg total) by mouth 2 (two) times daily.   14 tablet   0   . ESTROPIPATE 1.5 MG PO TABS   Oral   Take 1.5 mg by mouth daily.           . OMEGA-3 FATTY ACIDS 1000 MG PO CAPS   Oral   Take 2 g by mouth daily.           Marland Kitchen FLUCONAZOLE  150 MG PO TABS   Oral   Take 1 tablet (150 mg total) by mouth once. Repeat if needed   2 tablet   0   . LOSARTAN POTASSIUM 25 MG PO TABS   Oral   Take 25 mg by mouth daily.           Marland Kitchen POLYETHYLENE GLYCOL 3350 PO POWD   Oral   Take 17 g by mouth as directed. 3 times weekly          . ROSUVASTATIN CALCIUM 5 MG PO TABS   Oral   Take 5 mg by mouth daily.           Marland Kitchen SAXAGLIPTIN HCL 5 MG PO TABS   Oral   Take 5 mg by mouth daily.           Marland Kitchen VITAMIN B-12 1000 MCG PO TABS   Oral   Take 1,000 mcg by mouth daily.             There were no vitals taken for this visit.  Physical Exam  Vitals reviewed. Constitutional:       Morbidly obese, NAD    HENT:  Head: Normocephalic and atraumatic.  Eyes: Conjunctivae normal are normal. Pupils are equal, round, and reactive to light.  Neck: Normal range of motion. Neck supple.  Cardiovascular: Normal rate and regular rhythm.   Pulmonary/Chest: Effort normal and breath sounds normal.  Abdominal: Soft.       Obese abdomen Mild flank pain  Mild suprapubic tenderness   Musculoskeletal: Normal range of motion.  Neurological: She is alert.  Skin: Skin is warm.    ED Course  Procedures (including critical care time)   Labs Reviewed  POCT CBC W AUTO DIFF ([facility])  URINE CULTURE   No results found.   1. Dysuria   2. Candidal  vulvovaginitis       MDM  Will treat for UTI and vaginal candidiasis.  Cipro 500 BID x 7 days and diflucan 150mg  po x1. Urine culture.   CBC with no leukocytosis as well as pt being afebrile which is reassuring.  Discussed infectious red flags at length with pt.  Follow up as needed.     The patient and/or caregiver has been counseled thoroughly with regard to treatment plan and/or medications prescribed including dosage, schedule, interactions, rationale for use, and possible side effects and they verbalize understanding. Diagnoses and expected course of recovery discussed and will return if not improved as expected or if the condition worsens. Patient and/or caregiver verbalized understanding.            Doree Albee, MD 09/25/12 [phone]

## 2012-09-28 ENCOUNTER — Emergency Department: Admission: EM | Admit: 2012-09-28 | Discharge: 2012-09-28 | Discharge status: Absent without leave | Payer: Self-pay

## 2012-09-28 LAB — URINE CULTURE: Colony Count: 25000

## 2013-01-21 ENCOUNTER — Other Ambulatory Visit: Payer: Self-pay | Admitting: Orthopaedic Surgery

## 2013-01-21 DIAGNOSIS — R102 Pelvic and perineal pain: Secondary | ICD-10-CM

## 2013-01-27 ENCOUNTER — Ambulatory Visit
Admission: RE | Admit: 2013-01-27 | Discharge: 2013-01-27 | Disposition: A | Discharge status: Home / Self Care | Payer: Medicare Other | Source: Ambulatory Visit | Attending: Orthopaedic Surgery | Admitting: Orthopaedic Surgery

## 2013-05-07 ENCOUNTER — Encounter: Payer: Self-pay | Admitting: *Deleted

## 2013-05-07 ENCOUNTER — Emergency Department
Admission: EM | Admit: 2013-05-07 | Discharge: 2013-05-07 | Disposition: A | Discharge status: Home / Self Care | Payer: Medicare Other | Source: Home / Self Care | Attending: Family Medicine | Admitting: Family Medicine

## 2013-05-07 ENCOUNTER — Emergency Department (INDEPENDENT_AMBULATORY_CARE_PROVIDER_SITE_OTHER): Payer: Medicare Other

## 2013-05-07 DIAGNOSIS — S4980XA Other specified injuries of shoulder and upper arm, unspecified arm, initial encounter: Secondary | ICD-10-CM

## 2013-05-07 DIAGNOSIS — S46001A Unspecified injury of muscle(s) and tendon(s) of the rotator cuff of right shoulder, initial encounter: Secondary | ICD-10-CM

## 2013-05-07 DIAGNOSIS — M25519 Pain in unspecified shoulder: Secondary | ICD-10-CM

## 2013-05-07 DIAGNOSIS — M7521 Bicipital tendinitis, right shoulder: Secondary | ICD-10-CM

## 2013-05-07 DIAGNOSIS — W108XXA Fall (on) (from) other stairs and steps, initial encounter: Secondary | ICD-10-CM

## 2013-05-07 DIAGNOSIS — M7520 Bicipital tendinitis, unspecified shoulder: Secondary | ICD-10-CM

## 2013-05-07 MED ORDER — HYDROCODONE-ACETAMINOPHEN 5-325 MG PO TABS: ORAL_TABLET | ORAL | Status: DC

## 2013-05-07 NOTE — ED Notes (Signed)
Pt c/o RT arm/shoulder pain x 2-3 wks, post fall. She has an appt with her orthopedist in 2wks.

## 2013-05-07 NOTE — ED Provider Notes (Signed)
History    CSN: [id number] Arrival date & time 05/07/13  1918  First MD Initiated Contact with Patient 05/07/13 1953     Chief Complaint  Patient presents with  . Arm Pain     HPI Comments: While at a restaurant 2.5 weeks ago, patient missed a step and fell, injuring her right shoulder.  She has had persistent pain and limited motion in her right shoulder.  She notes that her pain was initially in her right shoulder blade.  The pain is somewhat worse at night.  Patient is a 67 y.o. female presenting with shoulder injury. The history is provided by the patient.  Shoulder Injury This is a new problem. Episode onset: 2.5 weeks ago. The problem occurs constantly. The problem has been gradually worsening. Associated symptoms comments: Right shoulder blade pain . Exacerbated by: movement of right shoulder. Nothing relieves the symptoms. Treatments tried: Aleve. The treatment provided mild relief.   Past Medical History  Diagnosis Date  . Diabetes mellitus     type 1  . Hyperlipidemia   . Hypertension   . Seizures     disorder   Past Surgical History  Procedure Laterality Date  . Appendectomy    . Cesarean section    . Cholecystectomy    . Tubal ligation    . Abdominal hysterectomy      complete  . Kidney stones removed     Family History  Problem Relation Age of Onset  . Hypertension Mother   . Heart attack Mother   . Stroke Father   . Kidney disease Father   . Cancer Sister     breast- in 86's  . Diabetes Other     family hx of  . Hypertension Other     family hx of  . Cancer Other     prostate/ family hx of  . Cancer Sister 21    colon  . Stroke Other   . Heart attack Other    History  Substance Use Topics  . Smoking status: Former Games developer  . Smokeless tobacco: Not on file  . Alcohol Use: No   OB History   Grav Para Term Preterm Abortions TAB SAB Ect Mult Living                 Review of Systems  All other systems reviewed and are  negative.    Allergies  Penicillins  Home Medications   Current Outpatient Rx  Name  Route  Sig  Dispense  Refill  . aspirin 81 MG tablet   Oral   Take 81 mg by mouth daily.           . carbamazepine (TEGRETOL) 200 MG tablet   Oral   Take 200 mg by mouth 3 (three) times daily.           . cholecalciferol (VITAMIN D) 1000 UNITS tablet   Oral   Take 1,000 Units by mouth daily.           Marland Kitchen estropipate (OGEN) 1.5 MG tablet   Oral   Take 1.5 mg by mouth daily.           . fish oil-omega-3 fatty acids 1000 MG capsule   Oral   Take 2 g by mouth daily.           . fluconazole (DIFLUCAN) 150 MG tablet   Oral   Take 1 tablet (150 mg total) by mouth once. Repeat if needed   2  tablet   0   . HYDROcodone-acetaminophen (NORCO/VICODIN) 5-325 MG per tablet      Take one by mouth at bedtime as needed for pain   10 tablet   0   . losartan (COZAAR) 25 MG tablet   Oral   Take 25 mg by mouth daily.           . polyethylene glycol powder (MIRALAX) powder   Oral   Take 17 g by mouth as directed. 3 times weekly          . rosuvastatin (CRESTOR) 5 MG tablet   Oral   Take 5 mg by mouth daily.           . saxagliptin HCl (ONGLYZA) 5 MG TABS tablet   Oral   Take 5 mg by mouth daily.           . vitamin B-12 (CYANOCOBALAMIN) 1000 MCG tablet   Oral   Take 1,000 mcg by mouth daily.            BP 129/69  Pulse 75  Temp(Src) 97 F (36.1 C) (Oral)  Resp 18  Ht 5\' 5"  (1.651 m)  Wt 274 lb (124.286 kg)  BMI 45.6 kg/m2  SpO2 97% Physical Exam  Nursing note and vitals reviewed. Constitutional: She is oriented to person, place, and time. She appears well-developed and well-nourished. No distress.  Patient is obese (BMI 45.6)  HENT:  Head: Atraumatic.  Eyes: Conjunctivae are normal. Pupils are equal, round, and reactive to light.  Cardiovascular: Normal heart sounds.   Pulmonary/Chest: Breath sounds normal.  Musculoskeletal: She exhibits tenderness.        Right shoulder: She exhibits decreased range of motion, tenderness and decreased strength. She exhibits no bony tenderness, no swelling, no effusion, no crepitus and no deformity.       Arms: Patient unable to abduct right shoulder actively or passively above horizontal.  Apley's scratch test positive.  Positive empty can test.  There is distinct tenderness over insertion of right long head of biceps tendon.  There is tenderness over the sub-acromial bursa.  Distal neurovascular function is intact. There is tenderness over medial and inferior edges of the right scapula  Lymphadenopathy:    She has no cervical adenopathy.  Neurological: She is alert and oriented to person, place, and time.  Skin: Skin is warm and dry.    ED Course  Procedures  none   Dg Shoulder Right  05/07/2013   *RADIOLOGY REPORT*  Clinical Data: Post fall, now with persistent right-sided shoulder pain  RIGHT SHOULDER - 2+ VIEW  Comparison: None.  Findings:  Examination is degraded secondary to the patient body habitus and obliquity.  Note is made of a well corticated osseous fragment adjacent to the greater tuberosity.  No definite fracture or dislocation.  No definite evidence of calcific tendonitis.  Limited visualization of adjacent thorax normal.  Regional soft tissues are normal.  IMPRESSION: 1.  No definite acute findings. 2.  Well corticated osseous fragment adjacent to the greater tuberosity  suggestive of rotator cuff pathology.   Original Report Authenticated By: Tacey Ruiz, MD   1. Rotator cuff injury, right, initial encounter   2. Biceps tendonitis, right     MDM  Shoulder immobilizer applied. Rx for Lortab at bedtime. Apply ice pack two or three times daily.  Begin pendulum exercises.  Wear sling until evaluated by her orthopedist.  Lattie Haw, MD 05/07/13 2120

## 2013-09-08 ENCOUNTER — Ambulatory Visit (INDEPENDENT_AMBULATORY_CARE_PROVIDER_SITE_OTHER): Payer: Medicare Other | Admitting: General Surgery

## 2013-09-08 ENCOUNTER — Encounter (INDEPENDENT_AMBULATORY_CARE_PROVIDER_SITE_OTHER): Payer: Self-pay | Admitting: General Surgery

## 2013-09-08 ENCOUNTER — Encounter (INDEPENDENT_AMBULATORY_CARE_PROVIDER_SITE_OTHER): Payer: Self-pay

## 2013-09-08 VITALS — BP 138/80 | HR 76 | Temp 98.0°F | Resp 16 | Ht 64.0 in | Wt 287.6 lb

## 2013-09-08 DIAGNOSIS — K648 Other hemorrhoids: Secondary | ICD-10-CM

## 2013-09-08 MED ORDER — DEXTROSE 5 % IV SOLN: 900.0000 mg | INTRAVENOUS | Status: DC

## 2013-09-08 MED ORDER — HEPARIN SODIUM (PORCINE) 5000 UNIT/ML IJ SOLN: 5000.0000 [IU] | Freq: Once | INTRAMUSCULAR | Status: DC

## 2013-09-08 MED ORDER — GENTAMICIN SULFATE 40 MG/ML IJ SOLN: 5.0000 mg/kg | INTRAVENOUS | Status: DC

## 2013-09-08 MED ORDER — ALVIMOPAN 12 MG PO CAPS: 12.0000 mg | ORAL_CAPSULE | Freq: Once | ORAL | Status: DC

## 2013-09-08 NOTE — Progress Notes (Signed)
Chief Complaint  Patient presents with  . New Evaluation    eval anal nodule found on colonoscopy    HISTORY: Traci Sullivan is a 67 y.o. female who presents to the office with an anal nodule found on colonoscopy.  She states she fell one year ago and hurt her coccyx.   She reports bleeding with BM's.  Other symptoms include constipation.  This had been occurring for a couple years, since she's been taking narcotics.  She has tried miralax prn in the past with good success.  Certain foods makes the symptoms worse.   It is intermittent in nature.  Her bowel habits are regular and her bowel movements are sometimes hard.  Her fiber intake is dietary.  Her last colonoscopy was 10/1, where an small anal nodule was identified.  She reports some prolapsing tissue in the past.     Past Medical History  Diagnosis Date  . Diabetes mellitus     type 1  . Hyperlipidemia   . Hypertension   . Seizures     disorder  . Asthma       Past Surgical History  Procedure Laterality Date  . Appendectomy    . Cesarean section    . Cholecystectomy    . Tubal ligation    . Abdominal hysterectomy      complete  . Kidney stones removed          Current Outpatient Prescriptions  Medication Sig Dispense Refill  . aspirin 81 MG tablet Take 81 mg by mouth daily.        . carbamazepine (TEGRETOL) 200 MG tablet Take 200 mg by mouth 3 (three) times daily.        . cholecalciferol (VITAMIN D) 1000 UNITS tablet Take 1,000 Units by mouth daily.        Marland Kitchen estropipate (OGEN) 1.5 MG tablet Take 1.5 mg by mouth daily.        Marland Kitchen losartan (COZAAR) 25 MG tablet Take 25 mg by mouth daily.        Marland Kitchen ACCU-CHEK AVIVA PLUS test strip       . ACCU-CHEK FASTCLIX LANCETS MISC       . atorvastatin (LIPITOR) 20 MG tablet       . etodolac (LODINE) 400 MG tablet       . pioglitazone (ACTOS) 30 MG tablet       . temazepam (RESTORIL) 15 MG capsule        No current facility-administered medications for this visit.      Allergies   Allergen Reactions  . Penicillins     REACTION: Splotches      Family History  Problem Relation Age of Onset  . Hypertension Mother   . Heart attack Mother   . Stroke Father   . Kidney disease Father   . Cancer Sister     breast- in 65's  . Diabetes Other     family hx of  . Hypertension Other     family hx of  . Cancer Other     prostate/ family hx of  . Cancer Sister 70    colon  . Stroke Other   . Heart attack Other     History   Social History  . Marital Status: Married    Spouse Name: N/A    Number of Children: N/A  . Years of Education: N/A   Social History Main Topics  . Smoking status: Former Games developer  . Smokeless tobacco: Never  Used  . Alcohol Use: No  . Drug Use: No  . Sexual Activity: None   Other Topics Concern  . None   Social History Narrative  . None      REVIEW OF SYSTEMS - PERTINENT POSITIVES ONLY: Review of Systems - General ROS: negative for - chills, fever or weight loss Hematological and Lymphatic ROS: negative for - bleeding problems, blood clots or bruising Respiratory ROS: no cough, shortness of breath, or wheezing Cardiovascular ROS: no chest pain or dyspnea on exertion Gastrointestinal ROS: no abdominal pain, change in bowel habits, or black or bloody stools Genito-Urinary ROS: no dysuria, trouble voiding, or hematuria  EXAM: Filed Vitals:   09/08/13 1024  BP: 138/80  Pulse: 76  Temp: 98 F (36.7 C)  Resp: 16    General appearance: alert and cooperative Resp: clear to auscultation bilaterally Cardio: regular rate and rhythm GI: soft, non-tender; bowel sounds normal; no masses,  no organomegaly   Procedure: Anoscopy Surgeon: Maisie Fus Diagnosis: rectal bleeding  Assistant: Christella Scheuermann After the risks and benefits were explained, verbal consent was obtained for above procedure  Anesthesia: none Findings: Right posterior grade 3 internal hem, Grade 2 L lateral, unable to band due to pain    ASSESSMENT AND PLAN: Traci Sullivan is a 67 y.o. F with bleeding internal hemorrhoids.  On exam, I believe the nodule seen on colonoscopy was a partially prolapsed hemorrhoid.  We discussed medical management of bleeding internal hemorrhoids and constipation.  She will start a fiber supplement and a stool softener daily.  I will see her back in 3 months to check on her progression.      Traci Panda, MD Colon and Rectal Surgery / General Surgery [facility]      Visit Diagnoses: 1. Colon cancer     Primary Care Physician: Traci Gasser, MD

## 2013-09-08 NOTE — Patient Instructions (Signed)
Fiber Chart  You should 25-30g of fiber per day and drinking 8 glasses of water to help your bowels move regularly.  In the chart below you can look up how much fiber you are getting in an average day.  If you are not getting enough fiber, you should add a fiber supplement to your diet.  Examples of this include Metamucil, FiberCon and Citrucel.  These can be purchased at your local grocery store or pharmacy.      http://www.[employer].edu/offices/health/nutritioncoach/AtoZ/handouts/Fiber.pdf   GETTING TO GOOD BOWEL HEALTH. Irregular bowel habits such as constipation can lead to many problems over time.  Having one soft bowel movement a day is the most important way to prevent further problems.  The anorectal canal is designed to handle stretching and feces to safely manage our ability to get rid of solid waste (feces, poop, stool) out of our body.  BUT, hard constipated stools can act like ripping concrete bricks causing inflamed hemorrhoids, anal fissures, abdominal pain and bloating.     The goal: ONE SOFT BOWEL MOVEMENT A DAY!  To have soft, regular bowel movements:    Drink at least 8 tall glasses of water a day.     Take plenty of fiber.  Fiber is the undigested part of plant food that passes into the colon, acting s "natures broom" to encourage bowel motility and movement.  Fiber can absorb and hold large amounts of water. This results in a larger, bulkier stool, which is soft and easier to pass. Work gradually over several weeks up to 6 servings a day of fiber (25g a day even more if needed) in the form of: o Vegetables -- Root (potatoes, carrots, turnips), leafy green (lettuce, salad greens, celery, spinach), or cooked high residue (cabbage, broccoli, etc) o Fruit -- Fresh (unpeeled skin & pulp), Dried (prunes, apricots, cherries, etc ),  or stewed ( applesauce)  o Whole grain breads, pasta, etc (whole wheat)  o Bran cereals    Bulking Agents -- This type of water-retaining fiber generally is  easily obtained each day by one of the following:  o Psyllium bran -- The psyllium plant is remarkable because its ground seeds can retain so much water. This product is available as Metamucil, Konsyl, Effersyllium, Per Diem Fiber, or the less expensive generic preparation in drug and health food stores. Although labeled a laxative, it really is not a laxative.  o Methylcellulose -- This is another fiber derived from wood which also retains water. It is available as Citrucel. o Polyethylene Glycol - and "artificial" fiber commonly called Miralax or Glycolax.  It is helpful for people with gassy or bloated feelings with regular fiber o Flax Seed - a less gassy fiber than psyllium   No reading or other relaxing activity while on the toilet. If bowel movements take longer than 5 minutes, you are too constipated   AVOID CONSTIPATION.  High fiber and water intake usually takes care of this.  Sometimes a laxative is needed to stimulate more frequent bowel movements, but    Laxatives are not a good long-term solution as it can wear the colon out. o Osmotics (Milk of Magnesia, Fleets phosphosoda, Magnesium citrate, MiraLax, GoLytely) are safer than  o Stimulants (Senokot, Castor Oil, Dulcolax, Ex Lax)    o Do not take laxatives for more than 7days in a row.    IF SEVERELY CONSTIPATED, try a Bowel Retraining Program: o Do not use laxatives.  o Eat a diet high in roughage, such as   bran cereals and leafy vegetables.  o Drink six (6) ounces of prune or apricot juice each morning.  o Eat two (2) large servings of stewed fruit each day.  o Take one (1) heaping tablespoon of a psyllium-based bulking agent twice a day. Use sugar-free sweetener when possible to avoid excessive calories.  o Eat a normal breakfast.  o Set aside 15 minutes after breakfast to sit on the toilet, but do not strain to have a bowel movement.  o If you do not have a bowel movement by the third day, use an enema and repeat the above steps.     HEMORRHOIDS    Did you know... Hemorrhoids are one of the most common ailments known.  More than half the population will develop hemorrhoids, usually after age 30.  Millions of Americans currently suffer from hemorrhoids.  The average person suffers in silence for a long period before seeking medical care.  Today's treatment methods make some types of hemorrhoid removal much less painful.  What are hemorrhoids? Often described as "varicose veins of the anus and rectum", hemorrhoids are enlarged, bulging blood vessels in and about the anus and lower rectum. There are two types of hemorrhoids: external and internal, which refer to their location.  External (outside) hemorrhoids develop near the anus and are covered by very sensitive skin. These are usually painless. However, if a blood clot (thrombosis) develops in an external hemorrhoid, it becomes a painful, hard lump. The external hemorrhoid may bleed if it ruptures. Internal (inside) hemorrhoids develop within the anus beneath the lining. Painless bleeding and protrusion during bowel movements are the most common symptom. However, an internal hemorrhoid can cause severe pain if it is completely "prolapsed" - protrudes from the anal opening and cannot be pushed back inside.   What causes hemorrhoids? An exact cause is unknown; however, the upright posture of humans alone forces a great deal of pressure on the rectal veins, which sometimes causes them to bulge. Other contributing factors include:  . Aging  . Chronic constipation or diarrhea  . Pregnancy  . Heredity  . Straining during bowel movements  . Faulty bowel function due to overuse of laxatives or enemas . Spending long periods of time (e.g., reading) on the toilet  Whatever the cause, the tissues supporting the vessels stretch. As a result, the vessels dilate; their walls become thin and bleed. If the stretching and pressure continue, the weakened vessels protrude.  What are  the symptoms? If you notice any of the following, you could have hemorrhoids:  . Bleeding during bowel movements  . Protrusion during bowel movements . Itching in the anal area  . Pain  . Sensitive lump(s)  How are hemorrhoids treated? Mild symptoms can be relieved frequently by increasing the amount of fiber (e.g., fruits, vegetables, breads and cereals) and fluids in the diet. Eliminating excessive straining reduces the pressure on hemorrhoids and helps prevent them from protruding. A sitz bath - sitting in plain warm water for about 10 minutes - can also provide some relief . With these measures, the pain and swelling of most symptomatic hemorrhoids will decrease in two to seven days, and the firm lump should recede within four to six weeks. In cases of severe or persistent pain from a thrombosed hemorrhoid, your physician may elect to remove the hemorrhoid containing the clot with a small incision. Performed under local anesthesia as an outpatient, this procedure generally provides relief. Severe hemorrhoids may require special treatment, much of which can   be performed on an outpatient basis.  . Ligation - the rubber band treatment - works effectively on internal hemorrhoids that protrude with bowel movements. A small rubber band is placed over the hemorrhoid, cutting off its blood supply. The hemorrhoid and the band fall off in a few days and the wound usually heals in a week or two. This procedure sometimes produces mild discomfort and bleeding and may need to be repeated for a full effect.  There is a more intense version of this procedure that is done in the OR as outpatient surgery called THD.  It involves identifying blood vessels leading to the hemorrhoids and then tying them off with sutures.  This method is a little more painful than rubber band ligation but less painful than traditional hemorrhoidectomy and usually does not have to be repeated.  It is best for internal hemorrhoids that  bleed.  Rubber Band Ligation of Internal Hemorrhoids:  A.  Bulging, bleeding, internal hemorrhoid B.  Rubber band applied at the base of the hemorrhoid C.  About 7 days later, the banded hemorrhoid has fallen off leaving a small scar (arrow)  . Injection and Coagulation can also be used on bleeding hemorrhoids that do not protrude. Both methods are relatively painless and cause the hemorrhoid to shrivel up. . Hemorrhoidectomy - surgery to remove the hemorrhoids - is the most complete method for removal of internal and external hemorrhoids. It is necessary when (1) clots repeatedly form in external hemorrhoids; (2) ligation fails to treat internal hemorrhoids; (3) the protruding hemorrhoid cannot be reduced; or (4) there is persistent bleeding. A hemorrhoidectomy removes excessive tissue that causes the bleeding and protrusion. It is done under anesthesia using sutures, and may, depending upon circumstances, require hospitalization and a period of inactivity. Laser hemorrhoidectomies do not offer any advantage over standard operative techniques. They are also quite expensive, and contrary to popular belief, are no less painful.  Do hemorrhoids lead to cancer? No. There is no relationship between hemorrhoids and cancer. However, the symptoms of hemorrhoids, particularly bleeding, are similar to those of colorectal cancer and other diseases of the digestive system. Therefore, it is important that all symptoms are investigated by a physician specially trained in treating diseases of the colon and rectum and that everyone 50 years or older undergo screening tests for colorectal cancer. Do not rely on over-the-counter medications or other self-treatments. See a colorectal surgeon first so your symptoms can be properly evaluated and effective treatment prescribed.  2012 American Society of Colon & Rectal Surgeons     

## 2013-09-17 ENCOUNTER — Other Ambulatory Visit: Payer: Self-pay | Admitting: Family Medicine

## 2013-09-17 DIAGNOSIS — Z1231 Encounter for screening mammogram for malignant neoplasm of breast: Secondary | ICD-10-CM

## 2013-10-14 ENCOUNTER — Ambulatory Visit (INDEPENDENT_AMBULATORY_CARE_PROVIDER_SITE_OTHER): Payer: Medicare Other

## 2013-10-14 DIAGNOSIS — Z1231 Encounter for screening mammogram for malignant neoplasm of breast: Secondary | ICD-10-CM

## 2013-11-01 ENCOUNTER — Encounter (INDEPENDENT_AMBULATORY_CARE_PROVIDER_SITE_OTHER): Payer: Self-pay

## 2013-11-11 DIAGNOSIS — F410 Panic disorder [episodic paroxysmal anxiety] without agoraphobia: Secondary | ICD-10-CM

## 2013-11-11 HISTORY — DX: Panic disorder (episodic paroxysmal anxiety): F41.0

## 2013-11-24 ENCOUNTER — Ambulatory Visit (INDEPENDENT_AMBULATORY_CARE_PROVIDER_SITE_OTHER): Payer: Medicare Other | Admitting: General Surgery

## 2014-02-09 ENCOUNTER — Ambulatory Visit (INDEPENDENT_AMBULATORY_CARE_PROVIDER_SITE_OTHER): Payer: Medicare Other

## 2014-02-09 DIAGNOSIS — M2550 Pain in unspecified joint: Secondary | ICD-10-CM

## 2014-02-09 DIAGNOSIS — R5381 Other malaise: Secondary | ICD-10-CM

## 2014-02-09 DIAGNOSIS — M5430 Sciatica, unspecified side: Secondary | ICD-10-CM

## 2014-02-09 DIAGNOSIS — M25559 Pain in unspecified hip: Secondary | ICD-10-CM

## 2014-02-09 DIAGNOSIS — M6281 Muscle weakness (generalized): Secondary | ICD-10-CM

## 2014-02-14 ENCOUNTER — Encounter (INDEPENDENT_AMBULATORY_CARE_PROVIDER_SITE_OTHER): Payer: Medicare Other | Admitting: Physical Therapy

## 2014-02-14 DIAGNOSIS — M5430 Sciatica, unspecified side: Secondary | ICD-10-CM

## 2014-02-14 DIAGNOSIS — R5381 Other malaise: Secondary | ICD-10-CM

## 2014-02-14 DIAGNOSIS — M2550 Pain in unspecified joint: Secondary | ICD-10-CM

## 2014-02-14 DIAGNOSIS — M6281 Muscle weakness (generalized): Secondary | ICD-10-CM

## 2014-02-14 DIAGNOSIS — M25559 Pain in unspecified hip: Secondary | ICD-10-CM

## 2014-02-16 ENCOUNTER — Encounter: Payer: Medicare Other | Admitting: Physical Therapy

## 2014-02-21 ENCOUNTER — Encounter: Payer: Medicare Other | Admitting: Physical Therapy

## 2014-02-23 ENCOUNTER — Encounter (INDEPENDENT_AMBULATORY_CARE_PROVIDER_SITE_OTHER): Payer: Medicare Other | Admitting: Physical Therapy

## 2014-02-23 DIAGNOSIS — M6281 Muscle weakness (generalized): Secondary | ICD-10-CM

## 2014-02-23 DIAGNOSIS — R5381 Other malaise: Secondary | ICD-10-CM

## 2014-02-23 DIAGNOSIS — M5430 Sciatica, unspecified side: Secondary | ICD-10-CM

## 2014-02-23 DIAGNOSIS — M2550 Pain in unspecified joint: Secondary | ICD-10-CM

## 2014-02-23 DIAGNOSIS — R262 Difficulty in walking, not elsewhere classified: Secondary | ICD-10-CM

## 2014-02-23 DIAGNOSIS — M25559 Pain in unspecified hip: Secondary | ICD-10-CM

## 2014-02-27 ENCOUNTER — Emergency Department
Admission: EM | Admit: 2014-02-27 | Discharge: 2014-02-27 | Disposition: A | Discharge status: Home / Self Care | Payer: Medicare Other | Source: Home / Self Care | Attending: Family Medicine | Admitting: Family Medicine

## 2014-02-27 ENCOUNTER — Emergency Department (INDEPENDENT_AMBULATORY_CARE_PROVIDER_SITE_OTHER): Payer: Medicare Other

## 2014-02-27 ENCOUNTER — Encounter: Payer: Self-pay | Admitting: Emergency Medicine

## 2014-02-27 DIAGNOSIS — B9789 Other viral agents as the cause of diseases classified elsewhere: Principal | ICD-10-CM

## 2014-02-27 DIAGNOSIS — R05 Cough: Secondary | ICD-10-CM

## 2014-02-27 DIAGNOSIS — J069 Acute upper respiratory infection, unspecified: Secondary | ICD-10-CM

## 2014-02-27 DIAGNOSIS — J45909 Unspecified asthma, uncomplicated: Secondary | ICD-10-CM

## 2014-02-27 DIAGNOSIS — R059 Cough, unspecified: Secondary | ICD-10-CM

## 2014-02-27 DIAGNOSIS — J9801 Acute bronchospasm: Secondary | ICD-10-CM

## 2014-02-27 LAB — POCT CBC W AUTO DIFF (K'VILLE URGENT CARE)

## 2014-02-27 MED ORDER — GUAIFENESIN-CODEINE 100-10 MG/5ML PO SOLN: ORAL | Status: DC

## 2014-02-27 MED ORDER — AZITHROMYCIN 250 MG PO TABS: ORAL_TABLET | ORAL | Status: DC

## 2014-02-27 MED ORDER — PREDNISONE 20 MG PO TABS: 20.0000 mg | ORAL_TABLET | Freq: Two times a day (BID) | ORAL | Status: DC

## 2014-02-27 NOTE — ED Notes (Signed)
Patient c/o cough, wheezing, congestion, and SOBx 3 wks. Patient went to see pcp and was prescribed Doxycycline and Tessalon without relief.

## 2014-02-27 NOTE — ED Provider Notes (Signed)
CSN: [id number]     Arrival date & time 02/27/14  1147 History   First MD Initiated Contact with Patient 02/27/14 1259     Chief Complaint  Patient presents with  . Cough     HPI Comments: About 6 weeks ago patient developed a cough, followed by sore throat but no nasal congestion.  She states that her husband developed similar symptoms which resolved after about 10 days. Her cough became worse about 3 weeks ago and she visited her PCP who prescribed doxycycline 100mg  BID, Tessalon 100mg , 1 or 2 TID, and an albuterol inhaler.  She reports that she had no improvement after taking these medications.  Over the past several days she had developed increased cough, wheezing, and shortness of breath with activity.  She has felt hot and cold but no fever.  She now sometimes coughs until she gags. Patient reports that her diabetes is well controlled. She states that she had asthma as a child.  She reports that her Tdap is current.  The history is provided by the patient.    Past Medical History  Diagnosis Date  . Diabetes mellitus     type 1  . Hyperlipidemia   . Hypertension   . Seizures     disorder  . Asthma    Past Surgical History  Procedure Laterality Date  . Appendectomy    . Cesarean section    . Cholecystectomy    . Tubal ligation    . Abdominal hysterectomy      complete  . Kidney stones removed     Family History  Problem Relation Age of Onset  . Hypertension Mother   . Heart attack Mother   . Stroke Father   . Kidney disease Father   . Cancer Sister     breast- in 4540's  . Diabetes Other     family hx of  . Hypertension Other     family hx of  . Cancer Other     prostate/ family hx of  . Cancer Sister [id number]    colon  . Stroke Other   . Heart attack Other    History  Substance Use Topics  . Smoking status: Former Games developermoker  . Smokeless tobacco: Never Used  . Alcohol Use: No   OB History   Grav Para Term Preterm Abortions TAB SAB Ect Mult Living                  Review of Systems + sore throat, resolved + cough No pleuritic pain + wheezing No nasal congestion No post-nasal drainage No sinus pain/pressure No itchy/red eyes No earache No hemoptysis + SOB No fever, + chills No nausea No vomiting No abdominal pain No diarrhea No urinary symptoms No skin rash + fatigue No myalgias + headache Used OTC meds without relief  Allergies  Penicillins  Home Medications   Prior to Admission medications   Medication Sig Start Date End Date Taking? Authorizing Provider  ACCU-CHEK AVIVA PLUS test strip  08/19/13   Historical Provider, MD  ACCU-CHEK FASTCLIX LANCETS MISC  07/01/13   Historical Provider, MD  aspirin 81 MG tablet Take 81 mg by mouth daily.      Historical Provider, MD  atorvastatin (LIPITOR) 20 MG tablet  07/22/13   Historical Provider, MD  carbamazepine (TEGRETOL) 200 MG tablet Take 200 mg by mouth 3 (three) times daily.      Historical Provider, MD  cholecalciferol (VITAMIN D) 1000 UNITS tablet Take 1,000 Units  by mouth daily.      Historical Provider, MD  estropipate (OGEN) 1.5 MG tablet Take 1.5 mg by mouth daily.      Historical Provider, MD  etodolac (LODINE) 400 MG tablet  07/30/13   Historical Provider, MD  losartan (COZAAR) 25 MG tablet Take 25 mg by mouth daily.      Historical Provider, MD  pioglitazone (ACTOS) 30 MG tablet  09/03/13   Historical Provider, MD  temazepam (RESTORIL) 15 MG capsule  09/03/13   Historical Provider, MD   BP 135/82  Pulse 79  Temp(Src) 97.8 F (36.6 C) (Oral)  Resp 16  Ht 5' 4.5" (1.638 m)  Wt 280 lb (127.007 kg)  BMI 47.34 kg/m2  SpO2 99% Physical Exam Nursing notes and Vital Signs reviewed. Appearance:  Patient appears stated age, and in no acute distress.  Patient is obese (BMI 47.3) Eyes:  Pupils are equal, round, and reactive to light and accomodation.  Extraocular movement is intact.  Conjunctivae are not inflamed  Ears:  Canals normal.  Tympanic membranes normal.  Nose:  Mildly  congested turbinates.  No sinus tenderness.    Pharynx:  Normal Neck:  Supple.  Slightly tender shotty posterior nodes are palpated bilaterally  Lungs:   Faint bilateral wheezes.  Breath sounds are equal.  Heart:  Regular rate and rhythm without murmurs, rubs, or gallops.  Abdomen:  Nontender without masses or hepatosplenomegaly.  Bowel sounds are present.  No CVA or flank tenderness.  Extremities:  No edema.  No calf tenderness Skin:  No rash present.   ED Course  Procedures  none    Labs Reviewed  POCT CBC W AUTO DIFF ([facility]):  WBC 7.1; LY 49.6; MO 6.2; GR 44.2; Hgb 12.2; Platelets 385      Imaging Review Dg Chest 2 View  02/27/2014   CLINICAL DATA:  Persistent cough for 6 weeks.  Asthma.  EXAM: CHEST  2 VIEW  COMPARISON:  None.  FINDINGS: The heart size and mediastinal contours are within normal limits. Both lungs are clear. No evidence of pulmonary hyperinflation. No evidence of mass or effusion. The visualized skeletal structures are unremarkable.  IMPRESSION: No active cardiopulmonary disease.   Electronically Signed   By: Myles RosenthalJohn  Stahl M.D.   On: 02/27/2014 14:00     MDM   1. Viral URI with cough   2. Bronchospasm    Begin prednisone burst.  Begin Z-pack to cover atypicals.  Robitussin AC at bedtime. Take plain Mucinex (1200 mg guaifenesin) twice daily for cough and congestion.  Increase fluid intake, rest. May use Afrin nasal spray (or generic oxymetazoline) twice daily for about 5 days is sinus congestion develops.  Also recommend using saline nasal spray several times daily and saline nasal irrigation (AYR is a common brand) Stop all antihistamines for now, and other non-prescription cough/cold preparations. May discontinue albuterol inhaler if not helpful.   Follow-up with family doctor if not improving 7 to 10 days.     Lattie HawStephen A Nettie Wyffels, MD 02/27/14 [phone]

## 2014-02-27 NOTE — Discharge Instructions (Signed)
Take plain Mucinex (1200 mg guaifenesin) twice daily for cough and congestion.  Increase fluid intake, rest. May use Afrin nasal spray (or generic oxymetazoline) twice daily for about 5 days.  Also recommend using saline nasal spray several times daily and saline nasal irrigation (AYR is a common brand) Stop all antihistamines for now, and other non-prescription cough/cold preparations. May discontinue albuterol inhaler if not helpful.   Follow-up with family doctor if not improving 7 to 10 days.

## 2014-02-28 ENCOUNTER — Encounter: Payer: Medicare Other | Admitting: Physical Therapy

## 2014-03-02 ENCOUNTER — Encounter (INDEPENDENT_AMBULATORY_CARE_PROVIDER_SITE_OTHER): Payer: Medicare Other | Admitting: Physical Therapy

## 2014-03-02 DIAGNOSIS — M5430 Sciatica, unspecified side: Secondary | ICD-10-CM

## 2014-03-02 DIAGNOSIS — M2550 Pain in unspecified joint: Secondary | ICD-10-CM

## 2014-03-02 DIAGNOSIS — M25559 Pain in unspecified hip: Secondary | ICD-10-CM

## 2014-03-02 DIAGNOSIS — R5381 Other malaise: Secondary | ICD-10-CM

## 2014-03-02 DIAGNOSIS — R262 Difficulty in walking, not elsewhere classified: Secondary | ICD-10-CM

## 2014-03-02 DIAGNOSIS — M6281 Muscle weakness (generalized): Secondary | ICD-10-CM

## 2014-03-07 ENCOUNTER — Encounter (INDEPENDENT_AMBULATORY_CARE_PROVIDER_SITE_OTHER): Payer: Medicare Other | Admitting: Physical Therapy

## 2014-03-07 DIAGNOSIS — M25559 Pain in unspecified hip: Secondary | ICD-10-CM

## 2014-03-07 DIAGNOSIS — M5430 Sciatica, unspecified side: Secondary | ICD-10-CM

## 2014-03-07 DIAGNOSIS — R262 Difficulty in walking, not elsewhere classified: Secondary | ICD-10-CM

## 2014-03-07 DIAGNOSIS — M2550 Pain in unspecified joint: Secondary | ICD-10-CM

## 2014-03-07 DIAGNOSIS — M6281 Muscle weakness (generalized): Secondary | ICD-10-CM

## 2014-03-07 DIAGNOSIS — R5381 Other malaise: Secondary | ICD-10-CM

## 2014-03-09 ENCOUNTER — Encounter (INDEPENDENT_AMBULATORY_CARE_PROVIDER_SITE_OTHER): Payer: Medicare Other | Admitting: Physical Therapy

## 2014-03-09 DIAGNOSIS — M6281 Muscle weakness (generalized): Secondary | ICD-10-CM

## 2014-03-09 DIAGNOSIS — M5430 Sciatica, unspecified side: Secondary | ICD-10-CM

## 2014-03-09 DIAGNOSIS — M25559 Pain in unspecified hip: Secondary | ICD-10-CM

## 2014-03-09 DIAGNOSIS — R5381 Other malaise: Secondary | ICD-10-CM

## 2014-03-09 DIAGNOSIS — R262 Difficulty in walking, not elsewhere classified: Secondary | ICD-10-CM

## 2014-03-09 DIAGNOSIS — M2550 Pain in unspecified joint: Secondary | ICD-10-CM

## 2014-03-14 ENCOUNTER — Encounter (INDEPENDENT_AMBULATORY_CARE_PROVIDER_SITE_OTHER): Payer: Medicare Other | Admitting: Physical Therapy

## 2014-03-14 DIAGNOSIS — M6281 Muscle weakness (generalized): Secondary | ICD-10-CM

## 2014-03-14 DIAGNOSIS — M25559 Pain in unspecified hip: Secondary | ICD-10-CM

## 2014-03-14 DIAGNOSIS — M5430 Sciatica, unspecified side: Secondary | ICD-10-CM

## 2014-03-14 DIAGNOSIS — M2550 Pain in unspecified joint: Secondary | ICD-10-CM

## 2014-03-14 DIAGNOSIS — R262 Difficulty in walking, not elsewhere classified: Secondary | ICD-10-CM

## 2014-03-14 DIAGNOSIS — R5381 Other malaise: Secondary | ICD-10-CM

## 2014-03-21 ENCOUNTER — Encounter: Payer: Medicare Other | Admitting: Physical Therapy

## 2014-03-23 ENCOUNTER — Encounter: Payer: Medicare Other | Admitting: Physical Therapy

## 2014-03-28 ENCOUNTER — Encounter: Payer: Medicare Other | Admitting: Physical Therapy

## 2014-03-30 ENCOUNTER — Encounter: Payer: Medicare Other | Admitting: Physical Therapy

## 2014-04-05 ENCOUNTER — Other Ambulatory Visit: Payer: Self-pay | Admitting: Orthopaedic Surgery

## 2014-04-05 DIAGNOSIS — M545 Low back pain, unspecified: Secondary | ICD-10-CM

## 2014-04-06 ENCOUNTER — Encounter: Payer: Medicare Other | Admitting: Physical Therapy

## 2014-04-13 ENCOUNTER — Ambulatory Visit
Admission: RE | Admit: 2014-04-13 | Discharge: 2014-04-13 | Disposition: A | Discharge status: Home / Self Care | Payer: Medicare Other | Source: Ambulatory Visit | Attending: Orthopaedic Surgery | Admitting: Orthopaedic Surgery

## 2014-04-13 DIAGNOSIS — M545 Low back pain, unspecified: Secondary | ICD-10-CM

## 2014-06-06 ENCOUNTER — Encounter: Payer: Self-pay | Admitting: Interventional Cardiology

## 2014-06-20 ENCOUNTER — Other Ambulatory Visit (HOSPITAL_COMMUNITY): Payer: Self-pay | Admitting: Orthopaedic Surgery

## 2014-06-30 ENCOUNTER — Encounter (HOSPITAL_COMMUNITY): Payer: Self-pay | Admitting: Pharmacy Technician

## 2014-07-01 ENCOUNTER — Encounter (HOSPITAL_COMMUNITY)
Admission: RE | Admit: 2014-07-01 | Discharge: 2014-07-01 | Disposition: A | Discharge status: Home / Self Care | Payer: Medicare Other | Source: Ambulatory Visit | Attending: Orthopaedic Surgery | Admitting: Orthopaedic Surgery

## 2014-07-01 ENCOUNTER — Encounter (HOSPITAL_COMMUNITY): Payer: Self-pay

## 2014-07-01 DIAGNOSIS — M5126 Other intervertebral disc displacement, lumbar region: Secondary | ICD-10-CM | POA: Diagnosis present

## 2014-07-01 DIAGNOSIS — Z01818 Encounter for other preprocedural examination: Secondary | ICD-10-CM | POA: Diagnosis present

## 2014-07-01 HISTORY — DX: Personal history of urinary calculi: Z87.442

## 2014-07-01 HISTORY — DX: Adverse effect of unspecified anesthetic, initial encounter: T41.45XA

## 2014-07-01 HISTORY — DX: Unspecified osteoarthritis, unspecified site: M19.90

## 2014-07-01 HISTORY — DX: Gastro-esophageal reflux disease without esophagitis: K21.9

## 2014-07-01 HISTORY — DX: Type 2 diabetes mellitus without complications: E11.9

## 2014-07-01 HISTORY — DX: Other complications of anesthesia, initial encounter: T88.59XA

## 2014-07-01 LAB — URINALYSIS, ROUTINE W REFLEX MICROSCOPIC
BILIRUBIN URINE: NEGATIVE
GLUCOSE, UA: NEGATIVE mg/dL
HGB URINE DIPSTICK: NEGATIVE
Ketones, ur: NEGATIVE mg/dL
Leukocytes, UA: NEGATIVE
Nitrite: NEGATIVE
PROTEIN: NEGATIVE mg/dL
Specific Gravity, Urine: 1.018 (ref 1.005–1.030)
UROBILINOGEN UA: 0.2 mg/dL (ref 0.0–1.0)
pH: 6.5 (ref 5.0–8.0)

## 2014-07-01 LAB — CBC
HCT: 39.6 % (ref 36.0–46.0)
Hemoglobin: 13 g/dL (ref 12.0–15.0)
MCH: 31.5 pg (ref 26.0–34.0)
MCHC: 32.8 g/dL (ref 30.0–36.0)
MCV: 95.9 fL (ref 78.0–100.0)
Platelets: 279 10*3/uL (ref 150–400)
RBC: 4.13 MIL/uL (ref 3.87–5.11)
RDW: 12.7 % (ref 11.5–15.5)
WBC: 6.1 10*3/uL (ref 4.0–10.5)

## 2014-07-01 LAB — COMPREHENSIVE METABOLIC PANEL
ALBUMIN: 4.1 g/dL (ref 3.5–5.2)
ALT: 15 U/L (ref 0–35)
ANION GAP: 12 (ref 5–15)
AST: 17 U/L (ref 0–37)
Alkaline Phosphatase: 67 U/L (ref 39–117)
BILIRUBIN TOTAL: 0.2 mg/dL — AB (ref 0.3–1.2)
BUN: 11 mg/dL (ref 6–23)
CO2: 26 meq/L (ref 19–32)
CREATININE: 0.6 mg/dL (ref 0.50–1.10)
Calcium: 9.3 mg/dL (ref 8.4–10.5)
Chloride: 99 mEq/L (ref 96–112)
GFR calc Af Amer: >90 mL/min (ref >90)
GFR calc non Af Amer: >90 mL/min (ref >90)
Glucose, Bld: 116 mg/dL — ABNORMAL HIGH (ref 70–99)
Potassium: 4.2 mEq/L (ref 3.7–5.3)
Sodium: 137 mEq/L (ref 137–147)
Total Protein: 7.2 g/dL (ref 6.0–8.3)

## 2014-07-01 LAB — SURGICAL PCR SCREEN
MRSA, PCR: NEGATIVE
Staphylococcus aureus: NEGATIVE

## 2014-07-01 LAB — PROTIME-INR
INR: 1 (ref 0.00–1.49)
PROTHROMBIN TIME: 13.2 s (ref 11.6–15.2)

## 2014-07-01 NOTE — Pre-Procedure Instructions (Signed)
Traci Sullivan  07/01/2014   Your procedure is scheduled on:  August 31  [facility] at 10:30 AM.  Call this number if you have problems the morning of surgery: [phone]   Remember:   Do not eat food or drink liquids after midnight.   Take these medicines the morning of surgery with A SIP OF WATER: Tegretol, Zanaflex   STOP Aspirin Monday August 24   STOP/ Do not take Aspirin, Aleve, Naproxen, Advil, Ibuprofen, Motrin, Vitamins, Herbs, or Supplements starting August 24   Do not wear jewelry, make-up or nail polish.  Do not wear lotions, powders, or perfumes. You may wear deodorant.  Do not shave 48 hours prior to surgery. Men may shave face and neck.  Do not bring valuables to the hospital.  [facility] is not responsible for any belongings or valuables.               Contacts, dentures or bridgework may not be worn into surgery.  Leave suitcase in the car. After surgery it may be brought to your room.  For patients admitted to the hospital, discharge time is determined by your treatment team.               Special Instructions: See [facility] Preparing For Surgery   Please read over the following fact sheets that you were given: Pain Booklet, Coughing and Deep Breathing and Surgical Site Infection Prevention

## 2014-07-01 NOTE — Progress Notes (Signed)
07/01/14 1016  OBSTRUCTIVE SLEEP APNEA  Have you ever been diagnosed with sleep apnea through a sleep study? No  Do you snore loudly (loud enough to be heard through closed doors)?  1  Do you often feel tired, fatigued, or sleepy during the daytime? 0  Has anyone observed you stop breathing during your sleep? 0  Do you have, or are you being treated for high blood pressure? 1  BMI more than 35 kg/m2? 1  Age over 68 years old? 1  Neck circumference greater than 40 cm/16 inches? 1  Gender: 0  Obstructive Sleep Apnea Score 5  Score 4 or greater  Results sent to PCP

## 2014-07-07 NOTE — H&P (Signed)
  [facility]   A Division of [employer]   [address], [place], [place] [address] Telephone: [phone]  Fax: [phone]     PATIENT: Traci, Sullivan   MR#: [id number]  DOB: 15-Sep-1946       A 68 year old female seen with right greater than left hip, back and leg pain.  The pain has been gradually getting worse.  She has difficulty standing for a long period of time.  She has had 2 epidural steroid injections.  She has been ambulating with a walker. The patient's previous injection gave her some pretty short term relief.  MRI 04/13/2014 demonstrated L3-4 broad based disk protrusion with bilateral lateral recess stenosis.   She has not noted any focal weakness but has had give way weakness in the hips.  She reports decreased activity significantly.  She has been to physical therapy over the last few months but is not doing any home exercises this point.  She is fearful of falls and not being strong enough and fearful of the pain.  She has failed medical care with medical management and medication management.  He does get some relief sitting and lying down.   CURRENT MEDICATIONS:   Include Tegretol, Actos, estropipate 1.5 mg daily, Cozaar 40 mg daily and 1 aspirin a day 81 mg.     PAST SURGICAL HISTORY:   No previous surgeries.    FAMILY HISTORY:   Positive for diabetes.   SOCIAL HISTORY:   She is married to her husband [name].  She is retired.     REVIEW OF SYSTEMS:   Positive for acid reflux, diabetes and asthma.   PHYSICAL EXAMINATION:  The patient is alert and oriented, WD, WN, NAD.  Extraocular movements intact.  The patient is 5 feet 5 inches, 270 pounds.  Lungs are clear.  Heart:  regular rate and rhythm.  Abdomen is not tender.  Negative rebound.  Liver and spleen are not palpably enlarged.  Anterior tib, EHL is strong.  Decrease sensation anterior thigh bilaterally.  She has bilateral sciatic notch tenderness.  Negative straight leg raise  90 degrees.    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     PLAN:  The plan would be bilateral microdiscectomy L3-4.          Mark C. Ophelia Charter, M.D.    Auto-Authenticated by Veverly Fells. Ophelia Charter, M.D.

## 2014-07-08 NOTE — Progress Notes (Signed)
Patient of new arrival time of 08:00

## 2014-07-10 MED ORDER — CHLORHEXIDINE GLUCONATE 4 % EX LIQD
60.0000 mL | Freq: Once | CUTANEOUS | Status: DC
  Filled 2014-07-10: qty 60

## 2014-07-10 MED ORDER — CLINDAMYCIN PHOSPHATE 900 MG/50ML IV SOLN
900.0000 mg | INTRAVENOUS | Status: AC
  Administered 2014-07-11: 900 mg via INTRAVENOUS
  Filled 2014-07-10: qty 50

## 2014-07-11 ENCOUNTER — Ambulatory Visit (HOSPITAL_COMMUNITY): Payer: Medicare Other | Admitting: Anesthesiology

## 2014-07-11 ENCOUNTER — Encounter (HOSPITAL_COMMUNITY): Payer: Medicare Other | Admitting: Anesthesiology

## 2014-07-11 ENCOUNTER — Encounter (HOSPITAL_COMMUNITY)
Admission: RE | Disposition: A | Discharge status: Home / Self Care | Payer: Self-pay | Source: Ambulatory Visit | Attending: Orthopaedic Surgery

## 2014-07-11 ENCOUNTER — Ambulatory Visit (HOSPITAL_COMMUNITY): Payer: Medicare Other

## 2014-07-11 ENCOUNTER — Observation Stay (HOSPITAL_COMMUNITY)
Admission: RE | Admit: 2014-07-11 | Discharge: 2014-07-12 | Disposition: A | Discharge status: Home / Self Care | Payer: Medicare Other | Source: Ambulatory Visit | Attending: Orthopaedic Surgery | Admitting: Orthopaedic Surgery

## 2014-07-11 DIAGNOSIS — J45909 Unspecified asthma, uncomplicated: Secondary | ICD-10-CM | POA: Diagnosis not present

## 2014-07-11 DIAGNOSIS — E669 Obesity, unspecified: Secondary | ICD-10-CM | POA: Insufficient documentation

## 2014-07-11 DIAGNOSIS — I10 Essential (primary) hypertension: Secondary | ICD-10-CM | POA: Insufficient documentation

## 2014-07-11 DIAGNOSIS — M5126 Other intervertebral disc displacement, lumbar region: Principal | ICD-10-CM | POA: Diagnosis present

## 2014-07-11 DIAGNOSIS — Z87891 Personal history of nicotine dependence: Secondary | ICD-10-CM | POA: Diagnosis not present

## 2014-07-11 DIAGNOSIS — Z880 Allergy status to penicillin: Secondary | ICD-10-CM | POA: Insufficient documentation

## 2014-07-11 DIAGNOSIS — Z6841 Body Mass Index (BMI) 40.0 and over, adult: Secondary | ICD-10-CM | POA: Diagnosis not present

## 2014-07-11 DIAGNOSIS — K219 Gastro-esophageal reflux disease without esophagitis: Secondary | ICD-10-CM | POA: Diagnosis not present

## 2014-07-11 DIAGNOSIS — E119 Type 2 diabetes mellitus without complications: Secondary | ICD-10-CM | POA: Diagnosis not present

## 2014-07-11 HISTORY — PX: LUMBAR LAMINECTOMY/DECOMPRESSION MICRODISCECTOMY: SHX5026

## 2014-07-11 LAB — GLUCOSE, CAPILLARY
Glucose-Capillary: 138 mg/dL — ABNORMAL HIGH (ref 70–99)
Glucose-Capillary: 135 mg/dL — ABNORMAL HIGH (ref 70–99)
Glucose-Capillary: 151 mg/dL — ABNORMAL HIGH (ref 70–99)
Glucose-Capillary: 91 mg/dL (ref 70–99)

## 2014-07-11 SURGERY — LUMBAR LAMINECTOMY/DECOMPRESSION MICRODISCECTOMY
Anesthesia: General

## 2014-07-11 MED ORDER — OXYCODONE-ACETAMINOPHEN 5-325 MG PO TABS: 1.0000 | ORAL_TABLET | ORAL | Status: DC | PRN

## 2014-07-11 MED ORDER — PIOGLITAZONE HCL 30 MG PO TABS
30.0000 mg | ORAL_TABLET | Freq: Every day | ORAL | Status: DC
  Administered 2014-07-12: 30 mg via ORAL
  Filled 2014-07-11 (×2): qty 1

## 2014-07-11 MED ORDER — KETOROLAC TROMETHAMINE 30 MG/ML IJ SOLN
INTRAMUSCULAR | Status: AC
  Administered 2014-07-11: 15:00:00
  Filled 2014-07-11: qty 1

## 2014-07-11 MED ORDER — THROMBIN 5000 UNITS EX SOLR
CUTANEOUS | Status: DC | PRN
  Administered 2014-07-11: 5000 [IU] via TOPICAL

## 2014-07-11 MED ORDER — PHENOL 1.4 % MT LIQD
1.0000 | OROMUCOSAL | Status: DC | PRN
Start: 2014-07-11 — End: 2014-07-12

## 2014-07-11 MED ORDER — HYDROCODONE-ACETAMINOPHEN 5-325 MG PO TABS
1.0000 | ORAL_TABLET | ORAL | Status: DC | PRN
  Administered 2014-07-11 (×2): 1 via ORAL
  Filled 2014-07-11 (×2): qty 1

## 2014-07-11 MED ORDER — VITAMIN D3 25 MCG (1000 UNIT) PO TABS
1000.0000 [IU] | ORAL_TABLET | Freq: Every day | ORAL | Status: DC
  Administered 2014-07-11 – 2014-07-12 (×2): 1000 [IU] via ORAL
  Filled 2014-07-11 (×2): qty 1

## 2014-07-11 MED ORDER — NEOSTIGMINE METHYLSULFATE 10 MG/10ML IV SOLN
INTRAVENOUS | Status: DC | PRN
  Administered 2014-07-11: 5 mg via INTRAVENOUS

## 2014-07-11 MED ORDER — LACTATED RINGERS IV SOLN
INTRAVENOUS | Status: DC
  Administered 2014-07-11: 09:00:00 via INTRAVENOUS

## 2014-07-11 MED ORDER — CARBAMAZEPINE 200 MG PO TABS
200.0000 mg | ORAL_TABLET | Freq: Three times a day (TID) | ORAL | Status: DC
  Administered 2014-07-11 – 2014-07-12 (×3): 200 mg via ORAL
  Filled 2014-07-11 (×5): qty 1

## 2014-07-11 MED ORDER — DEXMEDETOMIDINE HCL IN NACL 200 MCG/50ML IV SOLN
INTRAVENOUS | Status: AC
  Filled 2014-07-11: qty 50

## 2014-07-11 MED ORDER — METHOCARBAMOL 500 MG PO TABS
500.0000 mg | ORAL_TABLET | Freq: Four times a day (QID) | ORAL | Status: DC | PRN
  Administered 2014-07-12: 500 mg via ORAL
  Filled 2014-07-11 (×3): qty 1

## 2014-07-11 MED ORDER — SUCCINYLCHOLINE CHLORIDE 20 MG/ML IJ SOLN
INTRAMUSCULAR | Status: DC | PRN
  Administered 2014-07-11: 140 mg via INTRAVENOUS

## 2014-07-11 MED ORDER — GLYCOPYRROLATE 0.2 MG/ML IJ SOLN
INTRAMUSCULAR | Status: AC
  Filled 2014-07-11: qty 4

## 2014-07-11 MED ORDER — ONDANSETRON HCL 4 MG/2ML IJ SOLN: 4.0000 mg | INTRAMUSCULAR | Status: DC | PRN

## 2014-07-11 MED ORDER — ACETAMINOPHEN 650 MG RE SUPP: 650.0000 mg | RECTAL | Status: DC | PRN

## 2014-07-11 MED ORDER — MIDAZOLAM HCL 2 MG/2ML IJ SOLN
1.0000 mg | Freq: Once | INTRAMUSCULAR | Status: AC
  Administered 2014-07-11: 1 mg via INTRAVENOUS

## 2014-07-11 MED ORDER — DEXTROSE 5 % IV SOLN
INTRAVENOUS | Status: DC | PRN
  Administered 2014-07-11: 11:00:00 via INTRAVENOUS

## 2014-07-11 MED ORDER — KETOROLAC TROMETHAMINE 30 MG/ML IJ SOLN
30.0000 mg | Freq: Once | INTRAMUSCULAR | Status: AC
  Administered 2014-07-11: 30 mg via INTRAVENOUS
  Filled 2014-07-11: qty 1

## 2014-07-11 MED ORDER — DEXMEDETOMIDINE HCL 200 MCG/2ML IV SOLN
INTRAVENOUS | Status: DC | PRN
Start: 2014-07-11 — End: 2014-07-11
  Administered 2014-07-11: 16 ug via INTRAVENOUS
  Administered 2014-07-11: 4 ug via INTRAVENOUS
  Administered 2014-07-11: 8 ug via INTRAVENOUS

## 2014-07-11 MED ORDER — ATORVASTATIN CALCIUM 20 MG PO TABS
20.0000 mg | ORAL_TABLET | Freq: Every day | ORAL | Status: DC
  Administered 2014-07-11: 20 mg via ORAL
  Filled 2014-07-11 (×2): qty 1

## 2014-07-11 MED ORDER — METHOCARBAMOL 1000 MG/10ML IJ SOLN
500.0000 mg | Freq: Four times a day (QID) | INTRAVENOUS | Status: DC | PRN
  Administered 2014-07-11: 500 mg via INTRAVENOUS
  Filled 2014-07-11: qty 5

## 2014-07-11 MED ORDER — LACTATED RINGERS IV SOLN
INTRAVENOUS | Status: DC | PRN
  Administered 2014-07-11 (×2): via INTRAVENOUS

## 2014-07-11 MED ORDER — FENTANYL CITRATE 0.05 MG/ML IJ SOLN
INTRAMUSCULAR | Status: AC
  Filled 2014-07-11: qty 5

## 2014-07-11 MED ORDER — PROPOFOL 10 MG/ML IV BOLUS
INTRAVENOUS | Status: AC
  Filled 2014-07-11: qty 40

## 2014-07-11 MED ORDER — OXYCODONE-ACETAMINOPHEN 5-325 MG PO TABS
1.0000 | ORAL_TABLET | ORAL | Status: DC | PRN
  Administered 2014-07-11 – 2014-07-12 (×3): 2 via ORAL
  Filled 2014-07-11 (×3): qty 2

## 2014-07-11 MED ORDER — LIDOCAINE HCL (CARDIAC) 20 MG/ML IV SOLN
INTRAVENOUS | Status: AC
  Filled 2014-07-11: qty 5

## 2014-07-11 MED ORDER — MENTHOL 3 MG MT LOZG
1.0000 | LOZENGE | OROMUCOSAL | Status: DC | PRN
  Filled 2014-07-11: qty 9

## 2014-07-11 MED ORDER — POLYETHYLENE GLYCOL 3350 17 G PO PACK: 17.0000 g | PACK | Freq: Every day | ORAL | Status: DC | PRN

## 2014-07-11 MED ORDER — FENTANYL CITRATE 0.05 MG/ML IJ SOLN
INTRAMUSCULAR | Status: DC | PRN
  Administered 2014-07-11: 100 ug via INTRAVENOUS

## 2014-07-11 MED ORDER — GLYCOPYRROLATE 0.2 MG/ML IJ SOLN
INTRAMUSCULAR | Status: DC | PRN
  Administered 2014-07-11: .8 mg via INTRAVENOUS

## 2014-07-11 MED ORDER — PANTOPRAZOLE SODIUM 40 MG IV SOLR
40.0000 mg | Freq: Every day | INTRAVENOUS | Status: DC
  Administered 2014-07-11: 40 mg via INTRAVENOUS
  Filled 2014-07-11 (×2): qty 40

## 2014-07-11 MED ORDER — SODIUM CHLORIDE 0.9 % IJ SOLN: 3.0000 mL | INTRAMUSCULAR | Status: DC | PRN

## 2014-07-11 MED ORDER — BISACODYL 10 MG RE SUPP: 10.0000 mg | Freq: Every day | RECTAL | Status: DC | PRN

## 2014-07-11 MED ORDER — ROCURONIUM BROMIDE 100 MG/10ML IV SOLN
INTRAVENOUS | Status: DC | PRN
  Administered 2014-07-11: 20 mg via INTRAVENOUS
  Administered 2014-07-11: 50 mg via INTRAVENOUS
  Administered 2014-07-11: 10 mg via INTRAVENOUS

## 2014-07-11 MED ORDER — SCOPOLAMINE 1 MG/3DAYS TD PT72
1.0000 | MEDICATED_PATCH | TRANSDERMAL | Status: DC
  Administered 2014-07-11: 1 via TRANSDERMAL

## 2014-07-11 MED ORDER — ARTIFICIAL TEARS OP OINT
TOPICAL_OINTMENT | OPHTHALMIC | Status: AC
  Filled 2014-07-11: qty 3.5

## 2014-07-11 MED ORDER — MIDAZOLAM HCL 5 MG/5ML IJ SOLN
INTRAMUSCULAR | Status: DC | PRN
  Administered 2014-07-11 (×2): 0.5 mg via INTRAVENOUS

## 2014-07-11 MED ORDER — SCOPOLAMINE 1 MG/3DAYS TD PT72
MEDICATED_PATCH | TRANSDERMAL | Status: AC
  Filled 2014-07-11: qty 1

## 2014-07-11 MED ORDER — ONDANSETRON HCL 4 MG/2ML IJ SOLN
INTRAMUSCULAR | Status: DC | PRN
  Administered 2014-07-11: 4 mg via INTRAVENOUS

## 2014-07-11 MED ORDER — ONDANSETRON HCL 4 MG/2ML IJ SOLN
INTRAMUSCULAR | Status: AC
  Filled 2014-07-11: qty 2

## 2014-07-11 MED ORDER — KETAMINE HCL 100 MG/ML IJ SOLN
INTRAMUSCULAR | Status: DC | PRN
  Administered 2014-07-11: 25 mg via INTRAVENOUS

## 2014-07-11 MED ORDER — INSULIN ASPART 100 UNIT/ML ~~LOC~~ SOLN: 0.0000 [IU] | Freq: Every day | SUBCUTANEOUS | Status: DC

## 2014-07-11 MED ORDER — TRAMADOL HCL 50 MG PO TABS: 25.0000 mg | ORAL_TABLET | Freq: Two times a day (BID) | ORAL | Status: DC | PRN

## 2014-07-11 MED ORDER — SODIUM CHLORIDE 0.9 % IJ SOLN
3.0000 mL | Freq: Two times a day (BID) | INTRAMUSCULAR | Status: DC
  Administered 2014-07-11: 3 mL via INTRAVENOUS

## 2014-07-11 MED ORDER — PROMETHAZINE HCL 25 MG/ML IJ SOLN: 6.2500 mg | INTRAMUSCULAR | Status: DC | PRN

## 2014-07-11 MED ORDER — INSULIN ASPART 100 UNIT/ML ~~LOC~~ SOLN
0.0000 [IU] | Freq: Three times a day (TID) | SUBCUTANEOUS | Status: DC
  Administered 2014-07-11: 4 [IU] via SUBCUTANEOUS
  Administered 2014-07-12: 3 [IU] via SUBCUTANEOUS

## 2014-07-11 MED ORDER — BUPIVACAINE HCL (PF) 0.25 % IJ SOLN
INTRAMUSCULAR | Status: AC
  Filled 2014-07-11: qty 30

## 2014-07-11 MED ORDER — TEMAZEPAM 15 MG PO CAPS
15.0000 mg | ORAL_CAPSULE | Freq: Every evening | ORAL | Status: DC | PRN
  Administered 2014-07-11: 15 mg via ORAL
  Filled 2014-07-11: qty 1

## 2014-07-11 MED ORDER — MIDAZOLAM HCL 2 MG/2ML IJ SOLN
INTRAMUSCULAR | Status: AC
  Filled 2014-07-11: qty 2

## 2014-07-11 MED ORDER — THROMBIN 5000 UNITS EX SOLR
CUTANEOUS | Status: AC
  Filled 2014-07-11: qty 5000

## 2014-07-11 MED ORDER — HEMOSTATIC AGENTS (NO CHARGE) OPTIME
TOPICAL | Status: DC | PRN
  Administered 2014-07-11: 1 via TOPICAL

## 2014-07-11 MED ORDER — LIDOCAINE HCL (CARDIAC) 20 MG/ML IV SOLN
INTRAVENOUS | Status: DC | PRN
  Administered 2014-07-11: 40 mg via INTRAVENOUS

## 2014-07-11 MED ORDER — NEOSTIGMINE METHYLSULFATE 10 MG/10ML IV SOLN
INTRAVENOUS | Status: AC
  Filled 2014-07-11: qty 1

## 2014-07-11 MED ORDER — SUCCINYLCHOLINE CHLORIDE 20 MG/ML IJ SOLN
INTRAMUSCULAR | Status: AC
  Filled 2014-07-11: qty 1

## 2014-07-11 MED ORDER — LOSARTAN POTASSIUM 25 MG PO TABS
25.0000 mg | ORAL_TABLET | Freq: Every day | ORAL | Status: DC
  Administered 2014-07-11: 25 mg via ORAL
  Filled 2014-07-11 (×2): qty 1

## 2014-07-11 MED ORDER — SODIUM CHLORIDE 0.9 % IV SOLN: 250.0000 mL | INTRAVENOUS | Status: DC

## 2014-07-11 MED ORDER — KETAMINE HCL 100 MG/ML IJ SOLN
INTRAMUSCULAR | Status: AC
  Filled 2014-07-11: qty 1

## 2014-07-11 MED ORDER — ASPIRIN 81 MG PO CHEW
81.0000 mg | CHEWABLE_TABLET | Freq: Every day | ORAL | Status: DC
  Administered 2014-07-12: 81 mg via ORAL
  Filled 2014-07-11 (×2): qty 1

## 2014-07-11 MED ORDER — ACETAMINOPHEN 325 MG PO TABS: 650.0000 mg | ORAL_TABLET | ORAL | Status: DC | PRN

## 2014-07-11 MED ORDER — MIDAZOLAM HCL 2 MG/2ML IJ SOLN
INTRAMUSCULAR | Status: AC
Start: 2014-07-11 — End: 2014-07-11
  Administered 2014-07-11: 15:00:00
  Filled 2014-07-11: qty 2

## 2014-07-11 MED ORDER — SODIUM CHLORIDE 0.9 % IJ SOLN
INTRAMUSCULAR | Status: AC
  Filled 2014-07-11: qty 10

## 2014-07-11 MED ORDER — MEPERIDINE HCL 25 MG/ML IJ SOLN: 6.2500 mg | INTRAMUSCULAR | Status: DC | PRN

## 2014-07-11 MED ORDER — POTASSIUM CHLORIDE IN NACL 20-0.45 MEQ/L-% IV SOLN
INTRAVENOUS | Status: DC
  Administered 2014-07-11: 23:00:00 via INTRAVENOUS
  Filled 2014-07-11 (×4): qty 1000

## 2014-07-11 MED ORDER — PROPOFOL 10 MG/ML IV BOLUS
INTRAVENOUS | Status: DC | PRN
  Administered 2014-07-11: 150 mg via INTRAVENOUS

## 2014-07-11 MED ORDER — BUPIVACAINE HCL (PF) 0.25 % IJ SOLN
INTRAMUSCULAR | Status: DC | PRN
  Administered 2014-07-11: 10 mL

## 2014-07-11 MED ORDER — ROCURONIUM BROMIDE 50 MG/5ML IV SOLN
INTRAVENOUS | Status: AC
  Filled 2014-07-11: qty 2

## 2014-07-11 MED ORDER — 0.9 % SODIUM CHLORIDE (POUR BTL) OPTIME
TOPICAL | Status: DC | PRN
  Administered 2014-07-11: 1000 mL

## 2014-07-11 MED ORDER — FLEET ENEMA 7-19 GM/118ML RE ENEM: 1.0000 | ENEMA | Freq: Once | RECTAL | Status: AC | PRN

## 2014-07-11 MED ORDER — MORPHINE SULFATE 2 MG/ML IJ SOLN: 1.0000 mg | INTRAMUSCULAR | Status: DC | PRN

## 2014-07-11 MED ORDER — FENTANYL CITRATE 0.05 MG/ML IJ SOLN
25.0000 ug | INTRAMUSCULAR | Status: DC | PRN
  Administered 2014-07-11 (×4): 25 ug via INTRAVENOUS

## 2014-07-11 MED ORDER — ACETAMINOPHEN 10 MG/ML IV SOLN
1000.0000 mg | Freq: Once | INTRAVENOUS | Status: AC
  Administered 2014-07-11: 1000 mg via INTRAVENOUS
  Filled 2014-07-11: qty 100

## 2014-07-11 MED ORDER — FENTANYL CITRATE 0.05 MG/ML IJ SOLN
INTRAMUSCULAR | Status: AC
  Administered 2014-07-11: 25 ug via INTRAVENOUS
  Filled 2014-07-11: qty 2

## 2014-07-11 MED ORDER — DOCUSATE SODIUM 100 MG PO CAPS
100.0000 mg | ORAL_CAPSULE | Freq: Two times a day (BID) | ORAL | Status: DC
  Administered 2014-07-11 – 2014-07-12 (×2): 100 mg via ORAL
  Filled 2014-07-11 (×3): qty 1

## 2014-07-11 SURGICAL SUPPLY — 60 items
ADH SKN CLS APL DERMABOND .7 (GAUZE/BANDAGES/DRESSINGS) ×1
BUR ROUND FLUTED 4 SOFT TCH (BURR) ×2 IMPLANT
BUR ROUND FLUTED 4MM SOFT TCH (BURR) ×1
CORDS BIPOLAR (ELECTRODE) ×3 IMPLANT
COVER SURGICAL LIGHT HANDLE (MISCELLANEOUS) ×3 IMPLANT
DERMABOND ADVANCED (GAUZE/BANDAGES/DRESSINGS) ×2
DERMABOND ADVANCED .7 DNX12 (GAUZE/BANDAGES/DRESSINGS) ×1 IMPLANT
DRAPE C-ARM 42X72 X-RAY (DRAPES) ×2 IMPLANT
DRAPE MICROSCOPE LEICA (MISCELLANEOUS) ×3 IMPLANT
DRAPE POUCH INSTRU U-SHP 10X18 (DRAPES) ×2 IMPLANT
DRAPE PROXIMA HALF (DRAPES) ×8 IMPLANT
DRSG MEPILEX BORDER 4X4 (GAUZE/BANDAGES/DRESSINGS) ×3 IMPLANT
DRSG MEPILEX BORDER 4X8 (GAUZE/BANDAGES/DRESSINGS) ×3 IMPLANT
DURAPREP 26ML APPLICATOR (WOUND CARE) ×3 IMPLANT
ELECT BLADE 4.0 EZ CLEAN MEGAD (MISCELLANEOUS) ×3
ELECT REM PT RETURN 9FT ADLT (ELECTROSURGICAL) ×3
ELECTRODE BLDE 4.0 EZ CLN MEGD (MISCELLANEOUS) IMPLANT
ELECTRODE REM PT RTRN 9FT ADLT (ELECTROSURGICAL) ×1 IMPLANT
GLOVE BIO SURGEON STRL SZ 6.5 (GLOVE) ×2 IMPLANT
GLOVE BIO SURGEONS STRL SZ 6.5 (GLOVE) ×2
GLOVE BIOGEL PI IND STRL 6.5 (GLOVE) IMPLANT
GLOVE BIOGEL PI IND STRL 7.0 (GLOVE) IMPLANT
GLOVE BIOGEL PI IND STRL 7.5 (GLOVE) ×1 IMPLANT
GLOVE BIOGEL PI IND STRL 8 (GLOVE) ×1 IMPLANT
GLOVE BIOGEL PI INDICATOR 6.5 (GLOVE) ×4
GLOVE BIOGEL PI INDICATOR 7.0 (GLOVE) ×4
GLOVE BIOGEL PI INDICATOR 7.5 (GLOVE) ×2
GLOVE BIOGEL PI INDICATOR 8 (GLOVE) ×2
GLOVE ECLIPSE 7.0 STRL STRAW (GLOVE) ×3 IMPLANT
GLOVE ORTHO TXT STRL SZ7.5 (GLOVE) ×3 IMPLANT
GLOVE SURG SS PI 7.0 STRL IVOR (GLOVE) ×6 IMPLANT
GOWN STRL REUS W/ TWL LRG LVL3 (GOWN DISPOSABLE) ×2 IMPLANT
GOWN STRL REUS W/ TWL XL LVL3 (GOWN DISPOSABLE) ×1 IMPLANT
GOWN STRL REUS W/TWL LRG LVL3 (GOWN DISPOSABLE) ×12
GOWN STRL REUS W/TWL XL LVL3 (GOWN DISPOSABLE) ×3
KIT BASIN OR (CUSTOM PROCEDURE TRAY) ×3 IMPLANT
KIT ROOM TURNOVER OR (KITS) ×3 IMPLANT
MANIFOLD NEPTUNE II (INSTRUMENTS) ×3 IMPLANT
NDL HYPO 25GX1X1/2 BEV (NEEDLE) ×1 IMPLANT
NDL SPNL 18GX3.5 QUINCKE PK (NEEDLE) ×1 IMPLANT
NEEDLE HYPO 25GX1X1/2 BEV (NEEDLE) ×3 IMPLANT
NEEDLE SPNL 18GX3.5 QUINCKE PK (NEEDLE) ×3 IMPLANT
NS IRRIG 1000ML POUR BTL (IV SOLUTION) ×3 IMPLANT
PACK LAMINECTOMY ORTHO (CUSTOM PROCEDURE TRAY) ×3 IMPLANT
PAD ARMBOARD 7.5X6 YLW CONV (MISCELLANEOUS) ×6 IMPLANT
PATTIES SURGICAL .5 X.5 (GAUZE/BANDAGES/DRESSINGS) ×3 IMPLANT
PATTIES SURGICAL .75X.75 (GAUZE/BANDAGES/DRESSINGS) IMPLANT
SPONGE SURGIFOAM ABS GEL SZ50 (HEMOSTASIS) ×2 IMPLANT
SUT BONE WAX W31G (SUTURE) ×3 IMPLANT
SUT VIC AB 0 CT1 27 (SUTURE) ×3
SUT VIC AB 0 CT1 27XBRD ANBCTR (SUTURE) IMPLANT
SUT VIC AB 2-0 CT1 27 (SUTURE) ×3
SUT VIC AB 2-0 CT1 TAPERPNT 27 (SUTURE) ×1 IMPLANT
SUT VICRYL 0 TIES 12 18 (SUTURE) ×3 IMPLANT
SUT VICRYL 4-0 PS2 18IN ABS (SUTURE) IMPLANT
SUT VICRYL AB 2 0 TIES (SUTURE) ×3 IMPLANT
SYR CONTROL 10ML LL (SYRINGE) ×3 IMPLANT
TOWEL OR 17X24 6PK STRL BLUE (TOWEL DISPOSABLE) ×3 IMPLANT
TOWEL OR 17X26 10 PK STRL BLUE (TOWEL DISPOSABLE) ×3 IMPLANT
WATER STERILE IRR 1000ML POUR (IV SOLUTION) ×3 IMPLANT

## 2014-07-11 NOTE — Brief Op Note (Cosign Needed)
07/11/2014  12:31 PM  PATIENT:  Traci Sullivan  68 y.o. 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  PROCEDURE:  Procedure(s): MICRODISCECTOMY LUMBAR LAMINECTOMY -L3-L4 BILATERAL MICRODISCECTOMY  (N/A)  SURGEON:  Surgeon(s) and Role:    * Eldred Manges, MD - Primary  PHYSICIAN ASSISTANT: Maud Deed [facility]  ASSISTANTS: none   ANESTHESIA:   general  EBL:  Total I/O In: 50 [I.V.:50] Out: -   BLOOD ADMINISTERED:none  DRAINS: none   LOCAL MEDICATIONS USED:  MARCAINE     SPECIMEN:  No Specimen  DISPOSITION OF SPECIMEN:  N/A  COUNTS:  YES  TOURNIQUET:  * No tourniquets in log *  DICTATION: .Note written in EPIC  PLAN OF CARE: Admit for overnight observation  PATIENT DISPOSITION:  PACU - hemodynamically stable.   Delay start of Pharmacological VTE agent (>24hrs) due to surgical blood loss or risk of bleeding: yes

## 2014-07-11 NOTE — Transfer of Care (Signed)
Immediate Anesthesia Transfer of Care Note  Patient: Traci Sullivan  Procedure(s) Performed: Procedure(s): MICRODISCECTOMY LUMBAR LAMINECTOMY -L3-L4 BILATERAL MICRODISCECTOMY  (N/A)  Patient Location: PACU  Anesthesia Type:General  Level of Consciousness: awake, alert  and patient cooperative  Airway & Oxygen Therapy: Patient Spontanous Breathing and Patient connected to face mask oxygen  Post-op Assessment: Report given to PACU RN, Post -op Vital signs reviewed and stable and Patient moving all extremities  Post vital signs: Reviewed and stable  Complications: No apparent anesthesia complications

## 2014-07-11 NOTE — Anesthesia Preprocedure Evaluation (Addendum)
Anesthesia Evaluation   Patient awake    Reviewed: Allergy & Precautions, H&P , NPO status   Airway Mallampati: II      Dental  (+) Edentulous Upper, Edentulous Lower   Pulmonary asthma (clear today) , Sleep apnea: likely by BMI. , former smoker,  breath sounds clear to auscultation        Cardiovascular hypertension, Pt. on medications Rhythm:Regular Rate:Normal     Neuro/Psych    GI/Hepatic GERD-  Controlled,  Endo/Other  diabetes, Type 2, Oral Hypoglycemic Agents  Renal/GU      Musculoskeletal   Abdominal (+) + obese,  Abdomen: soft.    Peds  Hematology   Anesthesia Other Findings   Reproductive/Obstetrics                         Anesthesia Physical Anesthesia Plan  ASA: III  Anesthesia Plan: General   Post-op Pain Management:    Induction: Intravenous  Airway Management Planned: Oral ETT  Additional Equipment:   Intra-op Plan:   Post-operative Plan: Extubation in OR  Informed Consent: I have reviewed the patients History and Physical, chart, labs and discussed the procedure including the risks, benefits and alternatives for the proposed anesthesia with the patient or authorized representative who has indicated his/her understanding and acceptance.     Plan Discussed with:   Anesthesia Plan Comments:         Anesthesia Quick Evaluation

## 2014-07-11 NOTE — Anesthesia Postprocedure Evaluation (Signed)
  Anesthesia Post-op Note  Patient: Traci Sullivan  Procedure(s) Performed: Procedure(s): MICRODISCECTOMY LUMBAR LAMINECTOMY -L3-L4 BILATERAL MICRODISCECTOMY  (N/A)  Patient Location: PACU  Anesthesia Type:General  Level of Consciousness: alert  and patient cooperative  Airway and Oxygen Therapy: Patient Spontanous Breathing and Patient connected to nasal cannula oxygen  Post-op Pain: mild  Post-op Assessment: Post-op Vital signs reviewed, Patient's Cardiovascular Status Stable and Respiratory Function Stable  Post-op Vital Signs: Reviewed and stable  Last Vitals:  Filed Vitals:   07/11/14 1339  BP: 114/54  Pulse: 64  Temp:   Resp: 16    Complications: No apparent anesthesia complications

## 2014-07-11 NOTE — Discharge Instructions (Signed)
    No lifting greater than 10 lbs. Avoid bending, stooping and twisting. Walk in house for first week them may start to get out slowly increasing distance up to one mile by 3 weeks post op. Keep incision dry for 3 days, may use tegaderm or similar water impervious dressing.  

## 2014-07-11 NOTE — Interval H&P Note (Signed)
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 After consideration of risks, benefits and other options for treatment, the patient has consented to  Procedure(s): MICRODISCECTOMY LUMBAR LAMINECTOMY -L3-L4 BILATERAL MICRODISCECTOMY  (N/A) as a surgical intervention .  The patient's history has been reviewed, patient examined, no change in status, stable for surgery.  I have reviewed the patient's chart and labs.  Questions were answered to the patient's satisfaction.     Marylyn Appenzeller C

## 2014-07-12 ENCOUNTER — Encounter (HOSPITAL_COMMUNITY): Payer: Self-pay | Admitting: Orthopaedic Surgery

## 2014-07-12 DIAGNOSIS — M5126 Other intervertebral disc displacement, lumbar region: Secondary | ICD-10-CM | POA: Diagnosis not present

## 2014-07-12 LAB — GLUCOSE, CAPILLARY: Glucose-Capillary: 131 mg/dL — ABNORMAL HIGH (ref 70–99)

## 2014-07-12 NOTE — Evaluation (Signed)
Physical Therapy Evaluation Patient Details Name: Traci Sullivan MRN: [id number] DOB: 1946/09/12 Today's Date: 07/12/2014   History of Present Illness  pt presents with L3-4 Microdiskectomy.    Clinical Impression  Pt appears to be mobilizing near baseline level per pt.  Pt will have 24hr A from husband at home and would benefit from f/u with HHPT.  No further acute PT needs at this time, will sign off.      Follow Up Recommendations Home health PT;Supervision/Assistance - 24 hour    Equipment Recommendations  None recommended by PT    Recommendations for Other Services       Precautions / Restrictions Precautions Precautions: Back Precaution Booklet Issued: Yes (comment) Restrictions Weight Bearing Restrictions: No      Mobility  Bed Mobility               General bed mobility comments: pt sitting EOB with Nsg, but verbally reviewed log roll technique.    Transfers Overall transfer level: Needs assistance Equipment used: Rolling walker (2 wheeled) Transfers: Sit to/from Stand Sit to Stand: Min guard         General transfer comment: cues for UE use and  getting closer to chair prior to sitting.  pt tends to rush and starts to sit prematurely.    Ambulation/Gait Ambulation/Gait assistance: Supervision Ambulation Distance (Feet): 75 Feet (x2) Assistive device: Rolling walker (2 wheeled) Gait Pattern/deviations: Step-through pattern;Decreased stride length;Trunk flexed   Gait velocity interpretation: Below normal speed for age/gender General Gait Details: pt ambulation distance limited by spasms in R psoterior thigh and gluteal area.  pt required sitting rest break after each bout of gait.  Per pt this has been normal for some time for her.    Stairs Stairs: Yes Stairs assistance: Min guard Stair Management: Two rails;Forwards;Step to pattern Number of Stairs: 2 General stair comments: pt indicates she normally holds her rail on her L and husband on her R.   Allowed pt to use 2 rails as PT couldn't fit on practice steps with pt.    Wheelchair Mobility    Modified Rankin (Stroke Patients Only)       Balance                                             Pertinent Vitals/Pain Pain Assessment: 0-10 Pain Score: 8  Pain Location: R thigh and gluteal area.   Pain Descriptors / Indicators: Spasm Pain Intervention(s): Repositioned;Patient requesting pain meds-RN notified    Home Living Family/patient expects to be discharged to:: Private residence Living Arrangements: Spouse/significant other Available Help at Discharge: Family;Available 24 hours/day Type of Home: House Home Access: Stairs to enter Entrance Stairs-Rails: Left Entrance Stairs-Number of Steps: 2 Home Layout: One level Home Equipment: Walker - 2 wheels;Shower seat;Grab bars - tub/shower      Prior Function Level of Independence: Independent with assistive device(s)               Hand Dominance        Extremity/Trunk Assessment   Upper Extremity Assessment: Overall WFL for tasks assessed           Lower Extremity Assessment: Generalized weakness      Cervical / Trunk Assessment: Kyphotic  Communication   Communication: No difficulties  Cognition Arousal/Alertness: Awake/alert Behavior During Therapy: WFL for tasks assessed/performed Overall Cognitive Status: Within Functional Limits for  tasks assessed                      General Comments      Exercises        Assessment/Plan    PT Assessment All further PT needs can be met in the next venue of care  PT Diagnosis Difficulty walking;Acute pain   PT Problem List Decreased strength;Decreased activity tolerance;Decreased balance;Decreased mobility;Decreased knowledge of use of DME;Pain  PT Treatment Interventions     PT Goals (Current goals can be found in the Care Plan section) Acute Rehab PT Goals PT Goal Formulation: No goals set, d/c therapy    Frequency      Barriers to discharge        Co-evaluation               End of Session Equipment Utilized During Treatment: Gait belt Activity Tolerance: Patient tolerated treatment well Patient left: in chair;with call bell/phone within reach Nurse Communication: Mobility status    Functional Assessment Tool Used: Clinical Judgement Functional Limitation: Mobility: Walking and moving around Mobility: Walking and Moving Around Current Status (P0051): At least 1 percent but less than 20 percent impaired, limited or restricted Mobility: Walking and Moving Around Goal Status [phone]): At least 1 percent but less than 20 percent impaired, limited or restricted Mobility: Walking and Moving Around Discharge Status [phone]): At least 1 percent but less than 20 percent impaired, limited or restricted    Time: 1005-1033 PT Time Calculation (min): 28 min   Charges:   PT Evaluation $Initial PT Evaluation Tier I: 1 Procedure PT Treatments $Gait Training: 8-22 mins   PT G Codes:   Functional Assessment Tool Used: Clinical Judgement Functional Limitation: Mobility: Walking and moving around    Catarina Hartshorn, [place] [phone] 07/12/2014, 10:54 AM

## 2014-07-12 NOTE — Op Note (Signed)
[name], BROMAN                ACCOUNT NO.:  [id number]  MEDICAL RECORD NO.:  [id number]  LOCATION:  [id number]                        FACILITY:  [facility]  PHYSICIAN:  Mark C. Ophelia Charter, M.D.    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  PROCEDURE:  Bilateral L3-4 microdiskectomy.  SURGEON:  Mark C. Ophelia Charter, M.D.  ASSISTANT:  Maud Deed, PA-C, medically necessary and present for the entire procedure.  ANESTHESIA:  General.  ESTIMATED BLOOD LOSS:  100 mL.  DRAINS:  None.  COMPLICATIONS:  None.  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  She had failed conservative treatment including epidural steroid injections having trouble walking, leg weakness with quad weakness and giving way.  DESCRIPTION OF PROCEDURE:  After induction of general anesthesia, standard prepping and draping with DuraPrep, clindamycin IV due to penicillin allergy.  Area was squared with towels, Betadine and Steri- Drape applied.  Needle localization with spinal needle.  X-ray cassette had some problems.  Multiple x-rays were taken.  Finally the C-arm was brought in and this confirmed that the spinal needle was at L3-4 level. By the time the C-arm finally had been brought in, midline incision was then made after time-out and laminotomy was performed.  Deep retractors was placed.  Kocher tractor was placed over the disk space confirmed with lateral x-ray and then posterior element was removed leaving the top portion of the lamina and moving  laminotomy on both right and left side.  Inferior one-half of the spinous process.  There were hypertrophic thick chunks of ligament, which were removed ligamentum and the lateral gutter.  Disk space was exposed first on the right side. Nerve root gently retracted.  There was already disk herniation present and micropituitary was used to remove multiple chunks of disk that were trained by the ligament.  Sac was opened up and down micropituitary and straight pituitary was used.  Once satisfactory decompression performed identical procedure was repeated switching sides using a saw microscope and microdiskectomy was performed on the left.  Once there was good decompression, bone was removed out to the level of the pedicle and Epstein curettes were used to push disk material down and then removal with micropituitary straight up and down and regular straight up and down pituitaries.  Hockey stick could be passed anterior to the fact that the fragments with no areas of compression and irrigation.  Some epidural veins were coagulated with bipolar cautery.  Operative field was dry.  Standard closure 0 Vicryl in deep fascia, 2-0 Vicryl, subcuticular closure.  Dermabond for the skin, postop dressing, and transferred to the recovery room.  Instrument count and needle count was correct.     Mark C. Ophelia Charter, M.D.     MCY/MEDQ  D:  07/11/2014  T:  07/12/2014  Job:  [id number]

## 2014-07-12 NOTE — Progress Notes (Signed)
Subjective: 1 Day Post-Op Procedure(s) (LRB): MICRODISCECTOMY LUMBAR LAMINECTOMY -L3-L4 BILATERAL MICRODISCECTOMY  (N/A) Patient reports pain as 4 on 0-10 scale.    Objective: Vital signs in last 24 hours: Temp:  [97.5 F (36.4 C)-98.2 F (36.8 C)] 98.2 F (36.8 C) (09/01 0522) Pulse Rate:  [63-79] 70 (09/01 0522) Resp:  [14-20] 20 (09/01 0522) BP: (97-146)/(40-79) 97/51 mmHg (09/01 0522) SpO2:  [97 %-100 %] 98 % (09/01 0522) Weight:  [128.822 kg (284 lb)] 128.822 kg (284 lb) (08/31 0803)  Intake/Output from previous day: 08/31 0701 - 09/01 0700 In: 1590 [P.O.:240; I.V.:1350] Out: 52 [Urine:2; Blood:50] Intake/Output this shift:    No results found for this basename: HGB,  in the last 72 hours No results found for this basename: WBC, RBC, HCT, PLT,  in the last 72 hours No results found for this basename: NA, K, CL, CO2, BUN, CREATININE, GLUCOSE, CALCIUM,  in the last 72 hours No results found for this basename: LABPT, INR,  in the last 72 hours  Neurologically intact  Assessment/Plan: 1 Day Post-Op Procedure(s) (LRB): MICRODISCECTOMY LUMBAR LAMINECTOMY -L3-L4 BILATERAL MICRODISCECTOMY  (N/A) Plan:  D/C home . Has walker at home. Ambulatory to BR several time last night.   Orris Perin C 07/12/2014, 7:51 AM

## 2014-07-12 NOTE — Progress Notes (Signed)
Pt given discharge teaching and scripts; Pt verbalized understanding of d/c teaching; Pt left floor via wheelchair with spouse

## 2014-07-25 NOTE — Discharge Summary (Signed)
Physician Discharge Summary  Patient ID: Traci Sullivan MRN: [id number] DOB/AGE: 68-Jul-1947 68 y.o.  Admit date: 07/11/2014 Discharge date: 07/25/2014  Admission Diagnoses:  HNP (herniated nucleus pulposus), lumbar bilateral L3-4  Discharge Diagnoses:  Principal Problem:   HNP (herniated nucleus pulposus), lumbar   Past Medical History  Diagnosis Date  . Hyperlipidemia   . Hypertension   . Seizures     disorder  . Asthma   . Complication of anesthesia     reports that sometimes she is hard to wake up other time she is not  . Type II diabetes mellitus   . GERD (gastroesophageal reflux disease)     intermittent  . Arthritis   . History of kidney stones     Surgeries: Procedure(s): MICRODISCECTOMY LUMBAR LAMINECTOMY L3-L4 BILATERAL  on 07/11/2014   Consultants (if any):  none  Discharged Condition: Improved  Hospital Course: Traci Sullivan is an 68 y.o. female who was admitted 07/11/2014 with a diagnosis of HNP (herniated nucleus pulposus), lumbar and went to the operating room on 07/11/2014 and underwent the above named procedures.    She was given perioperative antibiotics:      Anti-infectives   Start     Dose/Rate Route Frequency Ordered Stop   07/11/14 0600  clindamycin (CLEOCIN) IVPB 900 mg     900 mg 100 mL/hr over 30 Minutes Intravenous On call to O.R. 07/10/14 1400 07/11/14 1045    .  She was given sequential compression devices, early ambulation for DVT prophylaxis.  She benefited maximally from the hospital stay and there were no complications.    Recent vital signs:  Filed Vitals:   07/12/14 0522  BP: 97/51  Pulse: 70  Temp: 98.2 F (36.8 C)  Resp: 20    Recent laboratory studies:  Lab Results  Component Value Date   HGB 13.0 07/01/2014   HGB 12.1 10/22/2007   Lab Results  Component Value Date   WBC 6.1 07/01/2014   PLT 279 07/01/2014   Lab Results  Component Value Date   INR 1.00 07/01/2014   Lab Results  Component Value Date   NA 137  07/01/2014   K 4.2 07/01/2014   CL 99 07/01/2014   CO2 26 07/01/2014   BUN 11 07/01/2014   CREATININE 0.60 07/01/2014   GLUCOSE 116* 07/01/2014    Discharge Medications:     Medication List         ACCU-CHEK AVIVA PLUS test strip  Generic drug:  glucose blood     ACCU-CHEK FASTCLIX LANCETS Misc     aspirin 81 MG tablet  Take 81 mg by mouth daily.     atorvastatin 20 MG tablet  Commonly known as:  LIPITOR  Take 20 mg by mouth daily at 6 PM.     carbamazepine 200 MG tablet  Commonly known as:  TEGRETOL  Take 200 mg by mouth 3 (three) times daily.     cholecalciferol 1000 UNITS tablet  Commonly known as:  VITAMIN D  Take 1,000 Units by mouth daily.     losartan 25 MG tablet  Commonly known as:  COZAAR  Take 25 mg by mouth daily.     oxyCODONE-acetaminophen 5-325 MG per tablet  Commonly known as:  ROXICET  Take 1-2 tablets by mouth every 4 (four) hours as needed for severe pain.     pioglitazone 30 MG tablet  Commonly known as:  ACTOS  Take 30 mg by mouth daily.     temazepam 15  MG capsule  Commonly known as:  RESTORIL  Take 15 mg by mouth at bedtime as needed for sleep.     tiZANidine 4 MG tablet  Commonly known as:  ZANAFLEX  Take 4 mg by mouth every 8 (eight) hours as needed for muscle spasms.     traMADol 50 MG tablet  Commonly known as:  ULTRAM  Take 25 mg by mouth 2 (two) times daily as needed. Takes half a tab BID PRN        Diagnostic Studies: Dg Lumbar Spine 1 View  2014-08-05   CLINICAL DATA:  L3-4 herniated disc  EXAM: LUMBAR SPINE - 1 VIEW; DG C-ARM 61-120 MIN  COMPARISON:  04/13/2014  FINDINGS: Single spot film obtained intraoperatively. Surgical instruments are noted posterior to the L3-4 interspace. No acute abnormality is noted. 4 seconds of fluoroscopy was utilized.   Electronically Signed   By: Alcide Clever M.D.   On: 08-05-2014 11:56   Dg C-arm 1-60 Min  2014-08-05   CLINICAL DATA:  L3-4 herniated disc  EXAM: LUMBAR SPINE - 1 VIEW; DG C-ARM  61-120 MIN  COMPARISON:  04/13/2014  FINDINGS: Single spot film obtained intraoperatively. Surgical instruments are noted posterior to the L3-4 interspace. No acute abnormality is noted. 4 seconds of fluoroscopy was utilized.   Electronically Signed   By: Alcide Clever M.D.   On: Aug 05, 2014 11:56    Disposition: 01-Home or Self Care  DISCHARGE INSTRUCTIONS: No lifting greater than 10 lbs. Avoid bending, stooping and twisting. Walk in house for first week them may start to get out slowly increasing distance up to one mile by 3 weeks post op. Keep incision dry for 3 days, may use tegaderm or similar water impervious dressing.   Follow-up Information   Follow up with Eldred Manges, MD. Schedule an appointment as soon as possible for a visit in 2 weeks.   Specialty:  Orthopedic Surgery   Contact information:   [address] [place] [address] [phone]        Signed: Wende Neighbors 07/25/2014, 11:37 AM

## 2014-08-22 ENCOUNTER — Ambulatory Visit (INDEPENDENT_AMBULATORY_CARE_PROVIDER_SITE_OTHER): Payer: Medicare Other | Admitting: Physical Therapy

## 2014-08-22 DIAGNOSIS — M545 Low back pain: Secondary | ICD-10-CM

## 2014-08-22 DIAGNOSIS — M6281 Muscle weakness (generalized): Secondary | ICD-10-CM

## 2014-08-24 ENCOUNTER — Encounter (INDEPENDENT_AMBULATORY_CARE_PROVIDER_SITE_OTHER): Payer: Medicare Other | Admitting: Physical Therapy

## 2014-08-24 DIAGNOSIS — M2560 Stiffness of unspecified joint, not elsewhere classified: Secondary | ICD-10-CM

## 2014-08-24 DIAGNOSIS — M545 Low back pain: Secondary | ICD-10-CM

## 2014-08-24 DIAGNOSIS — M6281 Muscle weakness (generalized): Secondary | ICD-10-CM

## 2014-08-29 ENCOUNTER — Encounter (INDEPENDENT_AMBULATORY_CARE_PROVIDER_SITE_OTHER): Payer: Medicare Other | Admitting: Physical Therapy

## 2014-08-29 DIAGNOSIS — M47817 Spondylosis without myelopathy or radiculopathy, lumbosacral region: Secondary | ICD-10-CM

## 2014-08-29 DIAGNOSIS — M6281 Muscle weakness (generalized): Secondary | ICD-10-CM

## 2014-08-29 DIAGNOSIS — M545 Low back pain: Secondary | ICD-10-CM

## 2014-08-29 DIAGNOSIS — M2560 Stiffness of unspecified joint, not elsewhere classified: Secondary | ICD-10-CM

## 2014-08-31 ENCOUNTER — Encounter (INDEPENDENT_AMBULATORY_CARE_PROVIDER_SITE_OTHER): Payer: Medicare Other | Admitting: Physical Therapy

## 2014-08-31 DIAGNOSIS — M545 Low back pain: Secondary | ICD-10-CM

## 2014-08-31 DIAGNOSIS — M47817 Spondylosis without myelopathy or radiculopathy, lumbosacral region: Secondary | ICD-10-CM

## 2014-08-31 DIAGNOSIS — M2560 Stiffness of unspecified joint, not elsewhere classified: Secondary | ICD-10-CM

## 2014-08-31 DIAGNOSIS — M6281 Muscle weakness (generalized): Secondary | ICD-10-CM

## 2014-08-31 DIAGNOSIS — R262 Difficulty in walking, not elsewhere classified: Secondary | ICD-10-CM

## 2014-09-05 ENCOUNTER — Encounter (INDEPENDENT_AMBULATORY_CARE_PROVIDER_SITE_OTHER): Payer: Medicare Other | Admitting: Physical Therapy

## 2014-09-05 DIAGNOSIS — R262 Difficulty in walking, not elsewhere classified: Secondary | ICD-10-CM

## 2014-09-05 DIAGNOSIS — M6281 Muscle weakness (generalized): Secondary | ICD-10-CM

## 2014-09-05 DIAGNOSIS — M47817 Spondylosis without myelopathy or radiculopathy, lumbosacral region: Secondary | ICD-10-CM

## 2014-09-05 DIAGNOSIS — M2560 Stiffness of unspecified joint, not elsewhere classified: Secondary | ICD-10-CM

## 2014-09-05 DIAGNOSIS — M545 Low back pain: Secondary | ICD-10-CM

## 2014-09-07 ENCOUNTER — Encounter: Payer: Self-pay | Admitting: Emergency Medicine

## 2014-09-07 ENCOUNTER — Emergency Department (INDEPENDENT_AMBULATORY_CARE_PROVIDER_SITE_OTHER)
Admission: EM | Admit: 2014-09-07 | Discharge: 2014-09-07 | Disposition: A | Discharge status: Home / Self Care | Payer: Medicare Other | Source: Home / Self Care | Attending: Family Medicine | Admitting: Family Medicine

## 2014-09-07 ENCOUNTER — Encounter: Payer: Medicare Other | Admitting: Physical Therapy

## 2014-09-07 DIAGNOSIS — H8111 Benign paroxysmal vertigo, right ear: Secondary | ICD-10-CM

## 2014-09-07 MED ORDER — MECLIZINE HCL 25 MG PO TABS: 12.5000 mg | ORAL_TABLET | Freq: Three times a day (TID) | ORAL | Status: DC | PRN

## 2014-09-07 NOTE — ED Notes (Signed)
Pt reports waking up with dizziness x 0700 today. Denies CP, arm pain or SOB. She reports her blood sugar was 120 at home.

## 2014-09-07 NOTE — Discharge Instructions (Signed)
Thank you for coming in today. Follow up with your primary care doctor Go to the emergency room if you get worse Take meclizine as needed for dizziness. This medication will make you sleepy and may increase your risk of falls   Benign Positional Vertigo Vertigo means you feel like you or your surroundings are moving when they are not. Benign positional vertigo is the most common form of vertigo. Benign means that the cause of your condition is not serious. Benign positional vertigo is more common in older adults. CAUSES  Benign positional vertigo is the result of an upset in the labyrinth system. This is an area in the middle ear that helps control your balance. This may be caused by a viral infection, head injury, or repetitive motion. However, often no specific cause is found. SYMPTOMS  Symptoms of benign positional vertigo occur when you move your head or eyes in different directions. Some of the symptoms may include:  Loss of balance and falls.  Vomiting.  Blurred vision.  Dizziness.  Nausea.  Involuntary eye movements (nystagmus). DIAGNOSIS  Benign positional vertigo is usually diagnosed by physical exam. If the specific cause of your benign positional vertigo is unknown, your caregiver may perform imaging tests, such as magnetic resonance imaging (MRI) or computed tomography (CT). TREATMENT  Your caregiver may recommend movements or procedures to correct the benign positional vertigo. Medicines such as meclizine, benzodiazepines, and medicines for nausea may be used to treat your symptoms. In rare cases, if your symptoms are caused by certain conditions that affect the inner ear, you may need surgery. HOME CARE INSTRUCTIONS   Follow your caregiver's instructions.  Move slowly. Do not make sudden body or head movements.  Avoid driving.  Avoid operating heavy machinery.  Avoid performing any tasks that would be dangerous to you or others during a vertigo episode.  Drink  enough fluids to keep your urine clear or pale yellow. SEEK IMMEDIATE MEDICAL CARE IF:   You develop problems with walking, weakness, numbness, or using your arms, hands, or legs.  You have difficulty speaking.  You develop severe headaches.  Your nausea or vomiting continues or gets worse.  You develop visual changes.  Your family or friends notice any behavioral changes.  Your condition gets worse.  You have a fever.  You develop a stiff neck or sensitivity to light. MAKE SURE YOU:   Understand these instructions.  Will watch your condition.  Will get help right away if you are not doing well or get worse. Document Released: 08/05/2006 Document Revised: 01/20/2012 Document Reviewed: 07/18/2011 [facility] Patient Information 2015 [place], [place]. This information is not intended to replace advice given to you by your health care provider. Make sure you discuss any questions you have with your health care provider.

## 2014-09-07 NOTE — ED Provider Notes (Signed)
Traci Sullivan is a 68 y.o. female who presents to Urgent Care today for vertigo. Patient awoke this morning experiencing vertigo. She notes brief intense episodes of vertigo worse with head motion. Her symptoms are improving since today. She had a similar episode about a week ago that lasted several hours and improved with Benadryl. She has not tried any medications yet. She denies any weakness or numbness trouble speaking or swallowing nausea vomiting or diarrhea. She had back surgery about 6 weeks ago and is doing well from that standpoint.   Past Medical History  Diagnosis Date  . Hyperlipidemia   . Hypertension   . Seizures     disorder  . Asthma   . Complication of anesthesia     reports that sometimes she is hard to wake up other time she is not  . Type II diabetes mellitus   . GERD (gastroesophageal reflux disease)     intermittent  . Arthritis   . History of kidney stones    History  Substance Use Topics  . Smoking status: Former Games developermoker  . Smokeless tobacco: Never Used     Comment: smoked as a teenager  . Alcohol Use: No   ROS as above Medications: No current facility-administered medications for this encounter.   Current Outpatient Prescriptions  Medication Sig Dispense Refill  . ACCU-CHEK AVIVA PLUS test strip       . ACCU-CHEK FASTCLIX LANCETS MISC       . aspirin 81 MG tablet Take 81 mg by mouth daily.        Marland Kitchen. atorvastatin (LIPITOR) 20 MG tablet Take 20 mg by mouth daily at 6 PM.       . carbamazepine (TEGRETOL) 200 MG tablet Take 200 mg by mouth 3 (three) times daily.        . cholecalciferol (VITAMIN D) 1000 UNITS tablet Take 1,000 Units by mouth daily.        Marland Kitchen. losartan (COZAAR) 25 MG tablet Take 25 mg by mouth daily.        . meclizine (ANTIVERT) 25 MG tablet Take 0.5-1 tablets (12.5-25 mg total) by mouth 3 (three) times daily as needed.  30 tablet  0  . oxyCODONE-acetaminophen (ROXICET) 5-325 MG per tablet Take 1-2 tablets by mouth every 4 (four) hours as  needed for severe pain.  60 tablet  0  . pioglitazone (ACTOS) 30 MG tablet Take 30 mg by mouth daily.       . temazepam (RESTORIL) 15 MG capsule Take 15 mg by mouth at bedtime as needed for sleep.       Marland Kitchen. tiZANidine (ZANAFLEX) 4 MG tablet Take 4 mg by mouth every 8 (eight) hours as needed for muscle spasms.      . traMADol (ULTRAM) 50 MG tablet Take 25 mg by mouth 2 (two) times daily as needed. Takes half a tab BID PRN        Exam:  BP 134/82  Pulse 79  Temp(Src) 97.9 F (36.6 C) (Oral)  Resp 18  Ht 5\' 4"  (1.626 m)  Wt 283 lb (128.368 kg)  BMI 48.55 kg/m2  SpO2 99% Gen: Well NAD HEENT: EOMI,  MMM PERRL tympanic membranes are normal-appearing bilaterally Lungs: Normal work of breathing. CTABL Heart: RRR no MRG Abd: NABS, Soft. Nondistended, Nontender Exts: Brisk capillary refill, warm and well perfused.  Neuro: Alert and oriented cranial nerves II through XII are intact normal coordination and strength bilateral upper extremity. Reflexes are intact. Sensation is intact. Patient  experienced brief episode of nystagmus with head motion.  No results found for this or any previous visit (from the past 24 hour(s)). No results found.  Assessment and Plan: 68 y.o. female with vertigo likely BPPV. Discussed risks. Treatment with meclizine. Follow-up with PCP.  Discussed warning signs or symptoms. Please see discharge instructions. Patient expresses understanding.     Rodolph BongEvan S Saragrace Selke, MD 09/07/14 [phone]

## 2014-09-08 ENCOUNTER — Encounter: Payer: Medicare Other | Admitting: Physical Therapy

## 2014-09-12 ENCOUNTER — Encounter (INDEPENDENT_AMBULATORY_CARE_PROVIDER_SITE_OTHER): Payer: Medicare Other | Admitting: Physical Therapy

## 2014-09-12 DIAGNOSIS — M47817 Spondylosis without myelopathy or radiculopathy, lumbosacral region: Secondary | ICD-10-CM

## 2014-09-12 DIAGNOSIS — R262 Difficulty in walking, not elsewhere classified: Secondary | ICD-10-CM

## 2014-09-12 DIAGNOSIS — M6281 Muscle weakness (generalized): Secondary | ICD-10-CM

## 2014-09-12 DIAGNOSIS — M545 Low back pain: Secondary | ICD-10-CM

## 2014-09-12 DIAGNOSIS — M2560 Stiffness of unspecified joint, not elsewhere classified: Secondary | ICD-10-CM

## 2014-09-15 ENCOUNTER — Encounter (INDEPENDENT_AMBULATORY_CARE_PROVIDER_SITE_OTHER): Payer: Medicare Other | Admitting: Physical Therapy

## 2014-09-15 DIAGNOSIS — M6281 Muscle weakness (generalized): Secondary | ICD-10-CM

## 2014-09-15 DIAGNOSIS — M545 Low back pain: Secondary | ICD-10-CM

## 2014-09-15 DIAGNOSIS — R262 Difficulty in walking, not elsewhere classified: Secondary | ICD-10-CM

## 2014-09-15 DIAGNOSIS — M47817 Spondylosis without myelopathy or radiculopathy, lumbosacral region: Secondary | ICD-10-CM

## 2014-09-15 DIAGNOSIS — M2560 Stiffness of unspecified joint, not elsewhere classified: Secondary | ICD-10-CM

## 2014-09-19 ENCOUNTER — Encounter (INDEPENDENT_AMBULATORY_CARE_PROVIDER_SITE_OTHER): Payer: Medicare Other | Admitting: Physical Therapy

## 2014-09-19 DIAGNOSIS — M2560 Stiffness of unspecified joint, not elsewhere classified: Secondary | ICD-10-CM

## 2014-09-19 DIAGNOSIS — R262 Difficulty in walking, not elsewhere classified: Secondary | ICD-10-CM

## 2014-09-19 DIAGNOSIS — M6281 Muscle weakness (generalized): Secondary | ICD-10-CM

## 2014-09-19 DIAGNOSIS — M47817 Spondylosis without myelopathy or radiculopathy, lumbosacral region: Secondary | ICD-10-CM

## 2014-09-19 DIAGNOSIS — M545 Low back pain: Secondary | ICD-10-CM

## 2014-09-21 ENCOUNTER — Encounter (INDEPENDENT_AMBULATORY_CARE_PROVIDER_SITE_OTHER): Payer: Medicare Other | Admitting: Physical Therapy

## 2014-09-21 DIAGNOSIS — M47817 Spondylosis without myelopathy or radiculopathy, lumbosacral region: Secondary | ICD-10-CM

## 2014-09-21 DIAGNOSIS — M2560 Stiffness of unspecified joint, not elsewhere classified: Secondary | ICD-10-CM

## 2014-09-21 DIAGNOSIS — M545 Low back pain: Secondary | ICD-10-CM

## 2014-09-21 DIAGNOSIS — R262 Difficulty in walking, not elsewhere classified: Secondary | ICD-10-CM

## 2014-09-21 DIAGNOSIS — M6281 Muscle weakness (generalized): Secondary | ICD-10-CM

## 2014-09-26 ENCOUNTER — Encounter (INDEPENDENT_AMBULATORY_CARE_PROVIDER_SITE_OTHER): Payer: Medicare Other | Admitting: Physical Therapy

## 2014-09-26 DIAGNOSIS — M47817 Spondylosis without myelopathy or radiculopathy, lumbosacral region: Secondary | ICD-10-CM

## 2014-09-26 DIAGNOSIS — M6281 Muscle weakness (generalized): Secondary | ICD-10-CM

## 2014-09-26 DIAGNOSIS — R262 Difficulty in walking, not elsewhere classified: Secondary | ICD-10-CM

## 2014-09-26 DIAGNOSIS — M2560 Stiffness of unspecified joint, not elsewhere classified: Secondary | ICD-10-CM

## 2014-09-26 DIAGNOSIS — M545 Low back pain: Secondary | ICD-10-CM

## 2014-09-28 ENCOUNTER — Encounter (INDEPENDENT_AMBULATORY_CARE_PROVIDER_SITE_OTHER): Payer: Medicare Other | Admitting: Physical Therapy

## 2014-09-28 DIAGNOSIS — M6281 Muscle weakness (generalized): Secondary | ICD-10-CM

## 2014-09-28 DIAGNOSIS — M2560 Stiffness of unspecified joint, not elsewhere classified: Secondary | ICD-10-CM

## 2014-09-28 DIAGNOSIS — M545 Low back pain: Secondary | ICD-10-CM

## 2014-09-28 DIAGNOSIS — M47817 Spondylosis without myelopathy or radiculopathy, lumbosacral region: Secondary | ICD-10-CM

## 2014-09-28 DIAGNOSIS — R262 Difficulty in walking, not elsewhere classified: Secondary | ICD-10-CM

## 2014-10-03 ENCOUNTER — Encounter (INDEPENDENT_AMBULATORY_CARE_PROVIDER_SITE_OTHER): Payer: Medicare Other | Admitting: Physical Therapy

## 2014-10-03 DIAGNOSIS — M47817 Spondylosis without myelopathy or radiculopathy, lumbosacral region: Secondary | ICD-10-CM

## 2014-10-03 DIAGNOSIS — M2560 Stiffness of unspecified joint, not elsewhere classified: Secondary | ICD-10-CM

## 2014-10-03 DIAGNOSIS — R262 Difficulty in walking, not elsewhere classified: Secondary | ICD-10-CM

## 2014-10-03 DIAGNOSIS — M6281 Muscle weakness (generalized): Secondary | ICD-10-CM

## 2014-10-03 DIAGNOSIS — M545 Low back pain: Secondary | ICD-10-CM

## 2014-10-12 ENCOUNTER — Encounter (INDEPENDENT_AMBULATORY_CARE_PROVIDER_SITE_OTHER): Payer: Medicare Other | Admitting: Physical Therapy

## 2014-10-12 DIAGNOSIS — M47817 Spondylosis without myelopathy or radiculopathy, lumbosacral region: Secondary | ICD-10-CM

## 2014-10-12 DIAGNOSIS — M2560 Stiffness of unspecified joint, not elsewhere classified: Secondary | ICD-10-CM

## 2014-10-12 DIAGNOSIS — R262 Difficulty in walking, not elsewhere classified: Secondary | ICD-10-CM

## 2014-10-12 DIAGNOSIS — M545 Low back pain: Secondary | ICD-10-CM

## 2014-10-12 DIAGNOSIS — M6281 Muscle weakness (generalized): Secondary | ICD-10-CM

## 2014-10-17 ENCOUNTER — Encounter (INDEPENDENT_AMBULATORY_CARE_PROVIDER_SITE_OTHER): Payer: Medicare Other | Admitting: Physical Therapy

## 2014-10-17 DIAGNOSIS — M47817 Spondylosis without myelopathy or radiculopathy, lumbosacral region: Secondary | ICD-10-CM

## 2014-10-17 DIAGNOSIS — R262 Difficulty in walking, not elsewhere classified: Secondary | ICD-10-CM

## 2014-10-17 DIAGNOSIS — M2560 Stiffness of unspecified joint, not elsewhere classified: Secondary | ICD-10-CM

## 2014-10-17 DIAGNOSIS — M6281 Muscle weakness (generalized): Secondary | ICD-10-CM

## 2014-10-17 DIAGNOSIS — M545 Low back pain: Secondary | ICD-10-CM

## 2014-10-19 ENCOUNTER — Encounter (INDEPENDENT_AMBULATORY_CARE_PROVIDER_SITE_OTHER): Payer: Medicare Other | Admitting: Physical Therapy

## 2014-10-19 DIAGNOSIS — M47817 Spondylosis without myelopathy or radiculopathy, lumbosacral region: Secondary | ICD-10-CM

## 2014-10-19 DIAGNOSIS — M2560 Stiffness of unspecified joint, not elsewhere classified: Secondary | ICD-10-CM

## 2014-10-19 DIAGNOSIS — M6281 Muscle weakness (generalized): Secondary | ICD-10-CM

## 2014-10-19 DIAGNOSIS — M545 Low back pain: Secondary | ICD-10-CM

## 2014-10-19 DIAGNOSIS — R262 Difficulty in walking, not elsewhere classified: Secondary | ICD-10-CM

## 2014-10-24 ENCOUNTER — Encounter (INDEPENDENT_AMBULATORY_CARE_PROVIDER_SITE_OTHER): Payer: Medicare Other | Admitting: Physical Therapy

## 2014-10-24 DIAGNOSIS — M2560 Stiffness of unspecified joint, not elsewhere classified: Secondary | ICD-10-CM

## 2014-10-24 DIAGNOSIS — M6281 Muscle weakness (generalized): Secondary | ICD-10-CM

## 2014-10-24 DIAGNOSIS — M47817 Spondylosis without myelopathy or radiculopathy, lumbosacral region: Secondary | ICD-10-CM

## 2014-10-24 DIAGNOSIS — M545 Low back pain: Secondary | ICD-10-CM

## 2014-10-24 DIAGNOSIS — R262 Difficulty in walking, not elsewhere classified: Secondary | ICD-10-CM

## 2014-10-26 ENCOUNTER — Encounter (INDEPENDENT_AMBULATORY_CARE_PROVIDER_SITE_OTHER): Payer: Medicare Other | Admitting: Physical Therapy

## 2014-10-26 DIAGNOSIS — M545 Low back pain: Secondary | ICD-10-CM

## 2014-10-26 DIAGNOSIS — M2560 Stiffness of unspecified joint, not elsewhere classified: Secondary | ICD-10-CM

## 2014-10-26 DIAGNOSIS — M5136 Other intervertebral disc degeneration, lumbar region: Secondary | ICD-10-CM

## 2014-10-26 DIAGNOSIS — M25559 Pain in unspecified hip: Secondary | ICD-10-CM

## 2014-10-26 DIAGNOSIS — R262 Difficulty in walking, not elsewhere classified: Secondary | ICD-10-CM

## 2014-10-26 DIAGNOSIS — M47817 Spondylosis without myelopathy or radiculopathy, lumbosacral region: Secondary | ICD-10-CM

## 2014-10-26 DIAGNOSIS — M6281 Muscle weakness (generalized): Secondary | ICD-10-CM

## 2014-10-31 ENCOUNTER — Encounter: Payer: Medicare Other | Admitting: Physical Therapy

## 2014-11-01 ENCOUNTER — Encounter: Payer: Medicare Other | Admitting: Physical Therapy

## 2014-11-07 ENCOUNTER — Encounter: Payer: Medicare Other | Admitting: Physical Therapy

## 2014-11-09 ENCOUNTER — Encounter: Payer: Medicare Other | Admitting: Physical Therapy

## 2014-12-08 ENCOUNTER — Ambulatory Visit (INDEPENDENT_AMBULATORY_CARE_PROVIDER_SITE_OTHER): Payer: 59 | Admitting: Physical Therapy

## 2014-12-08 DIAGNOSIS — M25559 Pain in unspecified hip: Secondary | ICD-10-CM

## 2014-12-08 DIAGNOSIS — M5136 Other intervertebral disc degeneration, lumbar region: Secondary | ICD-10-CM

## 2014-12-08 DIAGNOSIS — M6281 Muscle weakness (generalized): Secondary | ICD-10-CM

## 2014-12-08 DIAGNOSIS — M545 Low back pain: Secondary | ICD-10-CM

## 2014-12-12 ENCOUNTER — Encounter: Payer: 59 | Admitting: Physical Therapy

## 2014-12-14 ENCOUNTER — Encounter: Payer: 59 | Admitting: Physical Therapy

## 2014-12-19 ENCOUNTER — Encounter (INDEPENDENT_AMBULATORY_CARE_PROVIDER_SITE_OTHER): Payer: 59 | Admitting: Physical Therapy

## 2014-12-19 DIAGNOSIS — M5136 Other intervertebral disc degeneration, lumbar region: Secondary | ICD-10-CM

## 2014-12-19 DIAGNOSIS — M6281 Muscle weakness (generalized): Secondary | ICD-10-CM

## 2014-12-19 DIAGNOSIS — M545 Low back pain: Secondary | ICD-10-CM

## 2014-12-19 DIAGNOSIS — M25559 Pain in unspecified hip: Secondary | ICD-10-CM

## 2014-12-21 ENCOUNTER — Encounter (INDEPENDENT_AMBULATORY_CARE_PROVIDER_SITE_OTHER): Payer: 59 | Admitting: Physical Therapy

## 2014-12-21 DIAGNOSIS — M5136 Other intervertebral disc degeneration, lumbar region: Secondary | ICD-10-CM

## 2014-12-21 DIAGNOSIS — M545 Low back pain: Secondary | ICD-10-CM

## 2014-12-21 DIAGNOSIS — M6281 Muscle weakness (generalized): Secondary | ICD-10-CM

## 2014-12-21 DIAGNOSIS — M25559 Pain in unspecified hip: Secondary | ICD-10-CM

## 2014-12-26 ENCOUNTER — Encounter: Payer: 59 | Admitting: Physical Therapy

## 2014-12-28 ENCOUNTER — Encounter: Payer: Self-pay | Admitting: Physical Therapy

## 2014-12-28 ENCOUNTER — Ambulatory Visit (INDEPENDENT_AMBULATORY_CARE_PROVIDER_SITE_OTHER): Payer: 59 | Admitting: Physical Therapy

## 2014-12-28 DIAGNOSIS — M533 Sacrococcygeal disorders, not elsewhere classified: Secondary | ICD-10-CM

## 2014-12-28 DIAGNOSIS — G8929 Other chronic pain: Secondary | ICD-10-CM

## 2014-12-28 DIAGNOSIS — R531 Weakness: Secondary | ICD-10-CM

## 2014-12-28 NOTE — Therapy (Signed)
[facility] [address] [place], [place], [address] Phone: [phone]   Fax:  [phone]  Physical Therapy Treatment  Patient Details  Name: Traci Phoenixnnie C Sivertsen MRN: [id number] Date of Birth: 1946/02/01 Referring Provider:  Eldred MangesYates, Mark C, MD  Encounter Date: 12/28/2014      PT End of Session - 12/28/14 1404    Visit Number 4   Number of Visits 8   Date for PT Re-Evaluation 01/05/15   PT Start Time 1404   PT Stop Time 1452   PT Time Calculation (min) 48 min      Past Medical History  Diagnosis Date  . Hyperlipidemia   . Hypertension   . Seizures     disorder  . Asthma   . Complication of anesthesia     reports that sometimes she is hard to wake up other time she is not  . Type II diabetes mellitus   . GERD (gastroesophageal reflux disease)     intermittent  . Arthritis   . History of kidney stones     Past Surgical History  Procedure Laterality Date  . Appendectomy    . Cesarean section    . Cholecystectomy    . Tubal ligation    . Abdominal hysterectomy      complete  . Kidney stones removed    . Lumbar laminectomy/decompression microdiscectomy N/A 07/11/2014    Procedure: MICRODISCECTOMY LUMBAR LAMINECTOMY -L3-L4 BILATERAL MICRODISCECTOMY ;  Surgeon: Eldred MangesMark C Yates, MD;  Location: [facility];  Service: Orthopedics;  Laterality: N/A;  . Back surgery      There were no vitals taken for this visit.  Visit Diagnosis:  Chronic left SI joint pain  Generalized weakness      Subjective Assessment - 12/28/14 1409    Symptoms Saturday and Sunday were really good days, then the past two days has been sore.    Currently in Pain? Yes   Pain Score 6    Pain Location Back   Pain Orientation Left   Pain Descriptors / Indicators Aching   Pain Type Chronic pain   Pain Onset More than a month ago   Aggravating Factors  increased activity   Pain Relieving Factors rest                    [facility] Adult PT  Treatment/Exercise - 12/28/14 0001    Ambulation/Gait   Ambulation/Gait Yes   Ambulation/Gait Assistance 7: Independent  amb around clinic for cardio warm up, only tol 90sec due    Ambulation Distance (Feet) --  dizziness   Gait Pattern Wide base of support   Exercises   Exercises Lumbar  3x10 sit to/from stand transfers without UE assist   Lumbar Exercises: Standing   Functional Squats --  side stepping length of gym x 2 with HHA   Lumbar Exercises: Seated   Long Arc Quad on Chair 2 sets;10 reps   LAQ on Chair Weights (lbs) 5   Modalities   Modalities Electrical Stimulation;Moist Heat   Moist Heat Therapy   Number Minutes Moist Heat 15 Minutes   Moist Heat Location Other (comment)  low back Lt side   Electrical Stimulation   Electrical Stimulation Location left side, low back   Electrical Stimulation Action IFC                     PT Long Term Goals - 12/28/14 1440    PT LONG TERM GOAL #1  Title demonstrate and/or verbalize tehcniques to reduce the risk of re-injury to include info on posture and body mechanics   Time 4   Period Weeks   Status New   PT LONG TERM GOAL #2   Title I with HEP   Time 4   Period Weeks   Status New   PT LONG TERM GOAL #3   Title pain decrease to allow patient to return to walking program   Time 4   Period Weeks   Status New   PT LONG TERM GOAL #4   Title increase strength bilat hip extension =/> 4+/5   Time 4   Period Weeks   Status New   PT LONG TERM GOAL #5   Title improve FOTO =/< 51% limited, CK level   Time 4   Period Weeks   Status New               Plan - 12/28/14 1439    Clinical Impression Statement Patient very fatigued to day, developed dizziness and nausea during session, this limited functional strengthening.    Pt will benefit from skilled therapeutic intervention in order to improve on the following deficits Decreased strength;Pain;Cardiopulmonary status limiting activity;Decreased activity  tolerance;Decreased mobility   Rehab Potential Excellent   PT Frequency 2x / week   PT Duration 4 weeks   PT Treatment/Interventions Therapeutic exercise;Balance training;Neuromuscular re-education;Cryotherapy;Patient/family education;Gait training;Electrical Stimulation;Moist Heat;Stair training;Manual techniques;Ultrasound   Consulted and Agree with Plan of Care Patient        Problem List Patient Active Problem List   Diagnosis Date Noted  . HNP (herniated nucleus pulposus), lumbar 07/11/2014  . DIABETES MELLITUS, CONTROLLED 01/18/2011  . VAGINITIS 01/18/2011  . MENOPAUSAL SYNDROME 01/18/2011  . FATIGUE 01/18/2011  . HYPERTENSION 09/30/2009  . SEIZURE DISORDER 09/30/2009    Tanaja Ganger,SUE 12/28/2014, 2:47 PM  [facility] [address] [place], [place], [address] Phone: [phone]   Fax:  [phone]

## 2015-01-02 ENCOUNTER — Ambulatory Visit (INDEPENDENT_AMBULATORY_CARE_PROVIDER_SITE_OTHER): Payer: 59 | Admitting: Physical Therapy

## 2015-01-02 DIAGNOSIS — M533 Sacrococcygeal disorders, not elsewhere classified: Secondary | ICD-10-CM

## 2015-01-02 DIAGNOSIS — R531 Weakness: Secondary | ICD-10-CM

## 2015-01-02 DIAGNOSIS — G8929 Other chronic pain: Secondary | ICD-10-CM

## 2015-01-02 NOTE — Therapy (Signed)
[facility] [address] [place], [place], [address] Phone: [phone]   Fax:  [phone]  Physical Therapy Treatment  Patient Details  Name: Traci Sullivan MRN: [id number] Date of Birth: 1946-11-09 Referring Provider:  Eldred Manges, MD  Encounter Date: 01/02/2015      PT End of Session - 01/02/15 1143    Visit Number 5   Number of Visits 8   Date for PT Re-Evaluation 01/05/15   PT Start Time 1100   PT Stop Time 1155   PT Time Calculation (min) 55 min   Equipment Utilized During Treatment Other (comment)      Past Medical History  Diagnosis Date  . Hyperlipidemia   . Hypertension   . Seizures     disorder  . Asthma   . Complication of anesthesia     reports that sometimes she is hard to wake up other time she is not  . Type II diabetes mellitus   . GERD (gastroesophageal reflux disease)     intermittent  . Arthritis   . History of kidney stones     Past Surgical History  Procedure Laterality Date  . Appendectomy    . Cesarean section    . Cholecystectomy    . Tubal ligation    . Abdominal hysterectomy      complete  . Kidney stones removed    . Lumbar laminectomy/decompression microdiscectomy N/A 07/11/2014    Procedure: MICRODISCECTOMY LUMBAR LAMINECTOMY -L3-L4 BILATERAL MICRODISCECTOMY ;  Surgeon: Eldred Manges, MD;  [facility];  Service: Orthopedics;  Laterality: N/A;  . Back surgery      There were no vitals taken for this visit.  Visit Diagnosis:  Generalized weakness  Chronic left SI joint pain      Subjective Assessment - 01/02/15 1101    Symptoms Does not want to use weights anymore , hurt too much. Not doing well..   Currently in Pain? Yes   Pain Score --  2-3/10   Pain Location --  middle of back, greater on right side   Pain Orientation Right   Pain Descriptors / Indicators Aching;Burning   Pain Type Chronic pain   Pain Onset More than a month ago   Pain Frequency  Intermittent   Aggravating Factors  standing long periods. hart to wash dishes. house hold chores.   Pain Relieving Factors meds                    [facility] Adult PT Treatment/Exercise - 01/02/15 0001    Exercises   Exercises Lumbar   Lumbar Exercises: Stretches   Active Hamstring Stretch 1 rep;60 seconds  bilateral, with foot on stool, EO mat table    Lumbar Exercises: Aerobic   Stationary Bike --  Nu Step level 4 X 7 min.   Lumbar Exercises: Standing   Forward Lunge 5 reps  bilater, mini lunges with arm reach to opposite side   Lumbar Exercises: Seated   Sit to Stand 5 reps  from mat table, both hands, then one hand, no hands,   Lumbar Exercises: Sidelying   Clam 20 reps  bilateral   Modalities   Modalities Electrical Stimulation  IFC 15 min.left side with moist heat   Moist Heat Therapy   Number Minutes Moist Heat --  moist heat with double towel seated in chair for 15 min.  PT Long Term Goals - 12/28/14 1440    PT LONG TERM GOAL #1   Title demonstrate and/or verbalize tehcniques to reduce the risk of re-injury to include info on posture and body mechanics   Time 4   Period Weeks   Status New   PT LONG TERM GOAL #2   Title I with HEP   Time 4   Period Weeks   Status New   PT LONG TERM GOAL #3   Title pain decrease to allow patient to return to walking program   Time 4   Period Weeks   Status New   PT LONG TERM GOAL #4   Title increase strength bilat hip extension =/> 4+/5   Time 4   Period Weeks   Status New   PT LONG TERM GOAL #5   Title improve FOTO =/< 51% limited, CK level   Time 4   Period Weeks   Status New               Plan - 01/02/15 1206    Clinical Impression Statement Patient did well with ther ex today with increasing difficulty and progression for strength.   Rehab Potential Excellent   PT Frequency 2x / week   PT Duration 4 weeks   PT Treatment/Interventions Electrical  Stimulation;Cryotherapy;Therapeutic exercise;Neuromuscular re-education   PT Next Visit Plan continue to progress with strength and balance        Problem List Patient Active Problem List   Diagnosis Date Noted  . HNP (herniated nucleus pulposus), lumbar 07/11/2014  . DIABETES MELLITUS, CONTROLLED 01/18/2011  . VAGINITIS 01/18/2011  . MENOPAUSAL SYNDROME 01/18/2011  . FATIGUE 01/18/2011  . HYPERTENSION 09/30/2009  . SEIZURE DISORDER 09/30/2009    Traci Sullivan, PTA   01/02/2015, 12:43 PM  [facility] [address] [place], [place], [address] Phone: [phone]   Fax:  [phone]

## 2015-01-04 ENCOUNTER — Ambulatory Visit (INDEPENDENT_AMBULATORY_CARE_PROVIDER_SITE_OTHER): Payer: 59 | Admitting: Physical Therapy

## 2015-01-04 DIAGNOSIS — G8929 Other chronic pain: Secondary | ICD-10-CM

## 2015-01-04 DIAGNOSIS — M533 Sacrococcygeal disorders, not elsewhere classified: Secondary | ICD-10-CM

## 2015-01-04 DIAGNOSIS — R531 Weakness: Secondary | ICD-10-CM

## 2015-01-04 NOTE — Therapy (Signed)
[place], [place], [address] Phone: [phone]   Fax:  [phone]  Physical Therapy Treatment  Patient Details  Name: Traci Sullivan MRN: [id number] Date of Birth: 12-11-1945 Referring Provider:  Marybelle Killings, MD  Encounter Date: 01/04/2015      PT End of Session - 01/04/15 1417    Visit Number 6   Number of Visits 8   Date for PT Re-Evaluation 01/05/15   PT Start Time 4174   PT Stop Time 1451   PT Time Calculation (min) 39 min   Activity Tolerance Patient limited by fatigue;Patient tolerated treatment well      Past Medical History  Diagnosis Date  . Hyperlipidemia   . Hypertension   . Seizures     disorder  . Asthma   . Complication of anesthesia     reports that sometimes she is hard to wake up other time she is not  . Type II diabetes mellitus   . GERD (gastroesophageal reflux disease)     intermittent  . Arthritis   . History of kidney stones     Past Surgical History  Procedure Laterality Date  . Appendectomy    . Cesarean section    . Cholecystectomy    . Tubal ligation    . Abdominal hysterectomy      complete  . Kidney stones removed    . Lumbar laminectomy/decompression microdiscectomy N/A 07/11/2014    Procedure: MICRODISCECTOMY LUMBAR LAMINECTOMY -L3-L4 BILATERAL MICRODISCECTOMY ;  Surgeon: Marybelle Killings, MD;  Location: [facility];  Service: Orthopedics;  Laterality: N/A;  . Back surgery      There were no vitals taken for this visit.  Visit Diagnosis:  Generalized weakness  Chronic left SI joint pain      Subjective Assessment - 01/04/15 1411    Currently in Pain? Yes   Pain Score 1   reports pain was a 4/10 prior to taking pain med before appt.   Pain Location Back   Pain Descriptors / Indicators Throbbing;Sharp   Aggravating Factors  standing, bending   Pain Relieving Factors medicine, laying flat           [facility] PT Assessment - 01/04/15 0001    ROM  / Strength   AROM / PROM / Strength Strength   Strength   Strength Assessment Site Hip   Right/Left Hip Right;Left   Right Hip Extension 3+/5   Right Hip ABduction 3+/5   Left Hip Extension 4+/5   Left Hip ABduction 3+/5                  [facility] Adult PT Treatment/Exercise - 01/04/15 0001    Exercises   Exercises Lumbar;Knee/Hip   Lumbar Exercises: Stretches   Active Hamstring Stretch 2 reps;30 seconds  Seated    Piriformis Stretch 1 rep;30 seconds  seated   Lumbar Exercises: Aerobic   Stationary Bike NuStep level 4 x 5 min    Lumbar Exercises: Standing   Heel Raises 20 reps   Side Lunge 20 reps   Other Standing Lumbar Exercises Mini squats x 12    Lumbar Exercises: Supine   Ab Set 10 reps  5 sec hold with trans abd and ball squeeze in hooklying   Lumbar Exercises: Sidelying   Clam 20 reps  bilaterally   Knee/Hip Exercises: Stretches   Gastroc Stretch 2 reps;30 seconds   Knee/Hip Exercises: Standing   Lateral Step Up Right;Left;Both;10 reps  PT Education - 01/04/15 1703    Education provided Yes   Education Details HEP - added step ups (onto thick phone book) x 10 reps each side    Person(s) Educated Patient   Methods Explanation;Demonstration   Comprehension Verbalized understanding;Returned demonstration             PT Long Term Goals - 12/28/14 1440    PT LONG TERM GOAL #1   Title demonstrate and/or verbalize tehcniques to reduce the risk of re-injury to include info on posture and body mechanics   Time 4   Period Weeks   Status New   PT LONG TERM GOAL #2   Title I with HEP   Time 4   Period Weeks   Status New   PT LONG TERM GOAL #3   Title pain decrease to allow patient to return to walking program   Time 4   Period Weeks   Status New   PT LONG TERM GOAL #4   Title increase strength bilat hip extension =/> 4+/5   Time 4   Period Weeks   Status New   PT LONG TERM GOAL #5   Title improve FOTO =/< 51% limited, CK  level   Time 4   Period Weeks   Status New               Plan - 01/04/15 1458    Clinical Impression Statement Pt continues to demonstrate decreased weakness in BLE. Pt has been slowly progressing towards goals; no goals met.  Pt interested in continuing therapy to increase LB/LE strength. Pt tolerated all exercises without increase in symptoms.   PT Frequency 2x / week   PT Duration 4 weeks   PT Treatment/Interventions Electrical Stimulation;Cryotherapy;Therapeutic exercise;Neuromuscular re-education   PT Next Visit Plan Discussed with supervising PT regarding renewal of plan.    Consulted and Agree with Plan of Care Patient        Problem List Patient Active Problem List   Diagnosis Date Noted  . HNP (herniated nucleus pulposus), lumbar 07/11/2014  . DIABETES MELLITUS, CONTROLLED 01/18/2011  . VAGINITIS 01/18/2011  . MENOPAUSAL SYNDROME 01/18/2011  . FATIGUE 01/18/2011  . HYPERTENSION 09/30/2009  . SEIZURE DISORDER 09/30/2009   Kerin Perna, PTA 01/04/2015 5:06 PM  [place], [place], [address] Phone: [phone]   Fax:  [phone]

## 2015-01-04 NOTE — Therapy (Signed)
[place], [place], [address] Phone: [phone]   Fax:  [phone]  Physical Therapy Treatment  Patient Details  Name: Traci Sullivan MRN: [id number] Date of Birth: [birth year] Referring Provider:  Marybelle Killings, MD  Encounter Date: 01/04/2015      PT End of Session - 01/04/15 1417    Visit Number 6   Number of Visits 8   Date for PT Re-Evaluation 01/05/15   PT Start Time 8299   PT Stop Time 1451   PT Time Calculation (min) 39 min   Activity Tolerance Patient limited by fatigue;Patient tolerated treatment well      Past Medical History  Diagnosis Date  . Hyperlipidemia   . Hypertension   . Seizures     disorder  . Asthma   . Complication of anesthesia     reports that sometimes she is hard to wake up other time she is not  . Type II diabetes mellitus   . GERD (gastroesophageal reflux disease)     intermittent  . Arthritis   . History of kidney stones     Past Surgical History  Procedure Laterality Date  . Appendectomy    . Cesarean section    . Cholecystectomy    . Tubal ligation    . Abdominal hysterectomy      complete  . Kidney stones removed    . Lumbar laminectomy/decompression microdiscectomy N/A 07/11/2014    Procedure: MICRODISCECTOMY LUMBAR LAMINECTOMY -L3-L4 BILATERAL MICRODISCECTOMY ;  Surgeon: Marybelle Killings, MD;  Location: [facility];  Service: Orthopedics;  Laterality: N/A;  . Back surgery      There were no vitals taken for this visit.  Visit Diagnosis:  Generalized weakness - Plan: PT plan of care cert/re-cert  Chronic left SI joint pain - Plan: PT plan of care cert/re-cert      Subjective Assessment - 01/04/15 1411    Currently in Pain? Yes   Pain Score 1   reports pain was a 4/10 prior to taking pain med before appt.   Pain Location Back   Pain Descriptors / Indicators Throbbing;Sharp   Aggravating Factors  standing, bending   Pain Relieving Factors  medicine, laying flat           [facility] PT Assessment - 01/04/15 0001    Assessment   Medical Diagnosis Low back pain, s/p lumbar HNP   Next MD Visit 01/13/15   Observation/Other Assessments   Focus on Therapeutic Outcomes (FOTO)  50%   ROM / Strength   AROM / PROM / Strength Strength   Strength   Strength Assessment Site Hip   Right/Left Hip Right;Left   Right Hip Extension 3+/5   Right Hip ABduction 3+/5   Left Hip Extension 4+/5   Left Hip ABduction 3+/5                  [facility] Adult PT Treatment/Exercise - 01/04/15 0001    Exercises   Exercises Lumbar;Knee/Hip   Lumbar Exercises: Stretches   Active Hamstring Stretch 2 reps;30 seconds  Seated    Piriformis Stretch 1 rep;30 seconds  seated   Lumbar Exercises: Aerobic   Stationary Bike NuStep level 4 x 5 min    Lumbar Exercises: Standing   Heel Raises 20 reps   Side Lunge 20 reps   Other Standing Lumbar Exercises Mini squats x 12    Lumbar Exercises: Supine   Ab Set 10 reps  5 sec  hold with trans abd and ball squeeze in hooklying   Lumbar Exercises: Sidelying   Clam 20 reps  bilaterally   Knee/Hip Exercises: Stretches   Gastroc Stretch 2 reps;30 seconds   Knee/Hip Exercises: Standing   Lateral Step Up Right;Left;Both;10 reps                PT Education - 01/04/15 1703    Education provided Yes   Education Details HEP - added step ups (onto thick phone book) x 10 reps each side    Person(s) Educated Patient   Methods Explanation;Demonstration   Comprehension Verbalized understanding;Returned demonstration             PT Long Term Goals - 12/28/14 1440    PT LONG TERM GOAL #1   Title demonstrate and/or verbalize tehcniques to reduce the risk of re-injury to include info on posture and body mechanics   Time 4   Period Weeks   Status New   PT LONG TERM GOAL #2   Title I with HEP   Time 4   Period Weeks   Status New   PT LONG TERM GOAL #3   Title pain decrease to allow patient to  return to walking program   Time 4   Period Weeks   Status New   PT LONG TERM GOAL #4   Title increase strength bilat hip extension =/> 4+/5   Time 4   Period Weeks   Status New   PT LONG TERM GOAL #5   Title improve FOTO =/< 51% limited, CK level   Time 4   Period Weeks   Status New               Plan - 01/04/15 1458    Clinical Impression Statement Pt continues to demonstrate decreased weakness in BLE. Pt has been slowly progressing towards goals; no goals met.  Pt interested in continuing therapy to increase LB/LE strength. Pt tolerated all exercises without increase in symptoms.   PT Frequency 2x / week   PT Duration 4 weeks   PT Treatment/Interventions Electrical Stimulation;Cryotherapy;Therapeutic exercise;Neuromuscular re-education   PT Next Visit Plan Discussed with supervising PT regarding renewal of plan.    Consulted and Agree with Plan of Care Patient        Problem List Patient Active Problem List   Diagnosis Date Noted  . HNP (herniated nucleus pulposus), lumbar 07/11/2014  . DIABETES MELLITUS, CONTROLLED 01/18/2011  . VAGINITIS 01/18/2011  . MENOPAUSAL SYNDROME 01/18/2011  . FATIGUE 01/18/2011  . HYPERTENSION 09/30/2009  . SEIZURE DISORDER 09/30/2009    Jeral Pinch, PT 01/04/2015, 5:16 PM  [facility] [place], [place], [address] Phone: [phone]   Fax:  [phone]

## 2015-01-09 ENCOUNTER — Encounter: Payer: 59 | Admitting: Physical Therapy

## 2015-01-11 ENCOUNTER — Ambulatory Visit (INDEPENDENT_AMBULATORY_CARE_PROVIDER_SITE_OTHER): Payer: 59 | Admitting: Physical Therapy

## 2015-01-11 ENCOUNTER — Encounter: Payer: Self-pay | Admitting: Physical Therapy

## 2015-01-11 DIAGNOSIS — R531 Weakness: Secondary | ICD-10-CM

## 2015-01-11 DIAGNOSIS — M533 Sacrococcygeal disorders, not elsewhere classified: Secondary | ICD-10-CM

## 2015-01-11 DIAGNOSIS — G8929 Other chronic pain: Secondary | ICD-10-CM

## 2015-01-11 NOTE — Therapy (Addendum)
[facility], [place], [address] Phone: [phone]   Fax:  [phone]  Physical Therapy Treatment  Patient Details  Name: Traci Sullivan MRN: [id number] Date of Birth: 1946-08-23 Referring Provider:  Marybelle Killings, MD  Encounter Date: 01/11/2015      PT End of Session - 01/11/15 1132    Visit Number 7   Number of Visits 8   Date for PT Re-Evaluation 02/02/15   PT Start Time 1057   PT Stop Time 1137   PT Time Calculation (min) 40 min      Past Medical History  Diagnosis Date  . Hyperlipidemia   . Hypertension   . Seizures     disorder  . Asthma   . Complication of anesthesia     reports that sometimes she is hard to wake up other time she is not  . Type II diabetes mellitus   . GERD (gastroesophageal reflux disease)     intermittent  . Arthritis   . History of kidney stones     Past Surgical History  Procedure Laterality Date  . Appendectomy    . Cesarean section    . Cholecystectomy    . Tubal ligation    . Abdominal hysterectomy      complete  . Kidney stones removed    . Lumbar laminectomy/decompression microdiscectomy N/A 07/11/2014    Procedure: MICRODISCECTOMY LUMBAR LAMINECTOMY -L3-L4 BILATERAL MICRODISCECTOMY ;  Surgeon: Marybelle Killings, MD;  Location: [facility];  Service: Orthopedics;  Laterality: N/A;  . Back surgery      There were no vitals taken for this visit.  Visit Diagnosis:  Generalized weakness - Plan: PT plan of care cert/re-cert  Chronic left SI joint pain - Plan: PT plan of care cert/re-cert      Subjective Assessment - 01/11/15 1101    Symptoms doesn't feel well today   Pain Score 0-No pain          [facility] PT Assessment - 01/11/15 0001    Strength   Right Hip Extension 3+/5   Right Hip ABduction 3+/5   Left Hip Extension 4/5   Left Hip ABduction 4/5                  [facility] Adult PT Treatment/Exercise - 01/11/15 0001    Lumbar Exercises:  Stretches   Active Hamstring Stretch 2 reps;30 seconds  seated   Piriformis Stretch 1 rep;30 seconds  seated   Lumbar Exercises: Aerobic   Stationary Bike nustep 5 minutes level 4   Lumbar Exercises: Standing   Heel Raises 20 reps   Side Lunge 20 reps   Other Standing Lumbar Exercises mini squats x 10   Lumbar Exercises: Seated   Sit to Stand 5 reps  x 2 from chair no hands   Lumbar Exercises: Supine   Ab Set 10 reps  hooklying with ball squeeze   Other Supine Lumbar Exercises hooklying ball sqeeze with alt knee ext x 10 with cues for breathig   Lumbar Exercises: Sidelying   Clam 20 reps  bilat   Knee/Hip Exercises: Stretches   Gastroc Stretch 2 reps;30 seconds   Knee/Hip Exercises: Standing   Other Standing Knee Exercises hip ext and abd x 10 bilat                     PT Long Term Goals - 01/11/15 1137    PT LONG TERM GOAL #1  Title demonstrate and/or verbalize tehcniques to reduce the risk of re-injury to include info on posture and body mechanics   Time 4   Period Weeks   Status Not Met   PT LONG TERM GOAL #2   Title I with HEP   Time 4   Period Weeks   Status Not Met   PT LONG TERM GOAL #3   Title pain decrease to allow patient to return to walking program   Time 4   Period Weeks   Status Not Met   PT LONG TERM GOAL #4   Title increase strength bilat hip extension =/> 4+/5   Time 4   Period Weeks   Status Not Met               Plan - 01/11/15 1135    Clinical Impression Statement pt continues with decreased strength in B LE. pt slowly progressing towards goals. Pt wishes to continue therapy to increase mobility   Pt will benefit from skilled therapeutic intervention in order to improve on the following deficits Decreased strength;Pain;Cardiopulmonary status limiting activity;Decreased activity tolerance;Decreased mobility   Rehab Potential Excellent   PT Frequency 2x / week   PT Duration 4 weeks   PT Treatment/Interventions Electrical  Stimulation;Cryotherapy;Therapeutic exercise;Neuromuscular re-education   PT Next Visit Plan progress therex   Consulted and Agree with Plan of Care Patient        Problem List Patient Active Problem List   Diagnosis Date Noted  . HNP (herniated nucleus pulposus), lumbar 07/11/2014  . DIABETES MELLITUS, CONTROLLED 01/18/2011  . VAGINITIS 01/18/2011  . MENOPAUSAL SYNDROME 01/18/2011  . FATIGUE 01/18/2011  . HYPERTENSION 09/30/2009  . SEIZURE DISORDER 09/30/2009    Isabelle Course, PT, DPT  01/11/2015, 11:41 AM  [facility] [place], [place], [address] Phone: [phone]   Fax:  [phone]     PHYSICAL THERAPY DISCHARGE SUMMARY  Visits from Start of Care: 7  Current functional level related to goals / functional outcomes: See above   Remaining deficits: Weakness, as above   Education / Equipment: HEP Plan: Patient agrees to discharge.  Patient goals were not met. Patient is being discharged due to lack of progress.  ?????Pt reports she wishes to get better and improve however she lacks initiative to perform activities on her own.     Jeral Pinch, PT

## 2015-03-02 DIAGNOSIS — G500 Trigeminal neuralgia: Secondary | ICD-10-CM | POA: Insufficient documentation

## 2016-01-04 ENCOUNTER — Other Ambulatory Visit: Payer: Self-pay | Admitting: Orthopaedic Surgery

## 2016-01-04 DIAGNOSIS — M545 Low back pain: Secondary | ICD-10-CM

## 2016-01-16 ENCOUNTER — Other Ambulatory Visit: Payer: 59

## 2016-01-22 ENCOUNTER — Ambulatory Visit
Admission: RE | Admit: 2016-01-22 | Discharge: 2016-01-22 | Disposition: A | Discharge status: Home / Self Care | Payer: 59 | Source: Ambulatory Visit | Attending: Orthopaedic Surgery | Admitting: Orthopaedic Surgery

## 2016-01-22 DIAGNOSIS — M545 Low back pain: Secondary | ICD-10-CM

## 2016-01-22 MED ORDER — GADOBENATE DIMEGLUMINE 529 MG/ML IV SOLN
20.0000 mL | Freq: Once | INTRAVENOUS | Status: AC | PRN
Start: 2016-01-22 — End: 2016-01-22
  Administered 2016-01-22: 20 mL via INTRAVENOUS

## 2016-01-25 ENCOUNTER — Other Ambulatory Visit: Payer: Self-pay | Admitting: Orthopaedic Surgery

## 2016-01-28 ENCOUNTER — Encounter: Payer: Self-pay | Admitting: Emergency Medicine

## 2016-01-28 ENCOUNTER — Emergency Department
Admission: EM | Admit: 2016-01-28 | Discharge: 2016-01-28 | Disposition: A | Discharge status: Home / Self Care | Payer: 59 | Source: Home / Self Care | Attending: Family Medicine | Admitting: Family Medicine

## 2016-01-28 DIAGNOSIS — N3090 Cystitis, unspecified without hematuria: Secondary | ICD-10-CM

## 2016-01-28 LAB — POCT URINALYSIS DIP (MANUAL ENTRY)
BILIRUBIN UA: NEGATIVE
Glucose, UA: NEGATIVE
Ketones, POC UA: NEGATIVE
NITRITE UA: POSITIVE — AB
Protein Ur, POC: 100 — AB
Spec Grav, UA: 1.015 (ref 1.005–1.03)
Urobilinogen, UA: 1 (ref 0–1)
pH, UA: 8.5 (ref 5–8)

## 2016-01-28 MED ORDER — NITROFURANTOIN MONOHYD MACRO 100 MG PO CAPS: 100.0000 mg | ORAL_CAPSULE | Freq: Two times a day (BID) | ORAL | Status: DC

## 2016-01-28 NOTE — ED Notes (Signed)
Pt c/o either a yeast infection or a UTI.  Pt is having dysuria, urgency, discharge, taking AZO

## 2016-01-28 NOTE — Discharge Instructions (Signed)
Continue increased fluid intake.  May use non-prescription AZO for about two days, if desired, to decrease urinary discomfort.   If symptoms become significantly worse during the night or over the weekend, proceed to the local emergency room.    Urinary Tract Infection Urinary tract infections (UTIs) can develop anywhere along your urinary tract. Your urinary tract is your body's drainage system for removing wastes and extra water. Your urinary tract includes two kidneys, two ureters, a bladder, and a urethra. Your kidneys are a pair of bean-shaped organs. Each kidney is about the size of your fist. They are located below your ribs, one on each side of your spine. CAUSES Infections are caused by microbes, which are microscopic organisms, including fungi, viruses, and bacteria. These organisms are so small that they can only be seen through a microscope. Bacteria are the microbes that most commonly cause UTIs. SYMPTOMS  Symptoms of UTIs may vary by age and gender of the patient and by the location of the infection. Symptoms in young women typically include a frequent and intense urge to urinate and a painful, burning feeling in the bladder or urethra during urination. Older women and men are more likely to be tired, shaky, and weak and have muscle aches and abdominal pain. A fever may mean the infection is in your kidneys. Other symptoms of a kidney infection include pain in your back or sides below the ribs, nausea, and vomiting. DIAGNOSIS To diagnose a UTI, your caregiver will ask you about your symptoms. Your caregiver will also ask you to provide a urine sample. The urine sample will be tested for bacteria and white blood cells. White blood cells are made by your body to help fight infection. TREATMENT  Typically, UTIs can be treated with medication. Because most UTIs are caused by a bacterial infection, they usually can be treated with the use of antibiotics. The choice of antibiotic and length of  treatment depend on your symptoms and the type of bacteria causing your infection. HOME CARE INSTRUCTIONS  If you were prescribed antibiotics, take them exactly as your caregiver instructs you. Finish the medication even if you feel better after you have only taken some of the medication.  Drink enough water and fluids to keep your urine clear or pale yellow.  Avoid caffeine, tea, and carbonated beverages. They tend to irritate your bladder.  Empty your bladder often. Avoid holding urine for long periods of time.  Empty your bladder before and after sexual intercourse.  After a bowel movement, women should cleanse from front to back. Use each tissue only once. SEEK MEDICAL CARE IF:   You have back pain.  You develop a fever.  Your symptoms do not begin to resolve within 3 days. SEEK IMMEDIATE MEDICAL CARE IF:   You have severe back pain or lower abdominal pain.  You develop chills.  You have nausea or vomiting.  You have continued burning or discomfort with urination. MAKE SURE YOU:   Understand these instructions.  Will watch your condition.  Will get help right away if you are not doing well or get worse.   This information is not intended to replace advice given to you by your health care provider. Make sure you discuss any questions you have with your health care provider.   Document Released: 08/07/2005 Document Revised: 07/19/2015 Document Reviewed: 12/06/2011 Elsevier Interactive Patient Education [employer]2016 Elsevier Inc.

## 2016-01-28 NOTE — ED Provider Notes (Signed)
CSN: [id number]     Arrival date & time 01/28/16  1151 History   First MD Initiated Contact with Patient 01/28/16 1311     Chief Complaint  Patient presents with  . Dysuria      HPI Comments: Patient underwent MRI of her back 5 days ago in preparation for back surgery.  The next day she developed dysuria and urgency.  No pelvic pain.  No fevers, chills, and sweats.  No vaginal discharge.  Patient is a 70 y.o. female presenting with dysuria. The history is provided by the patient and a relative.  Dysuria Pain quality:  Burning Pain severity:  Mild Onset quality:  Sudden Duration:  4 days Timing:  Constant Progression:  Unchanged Chronicity:  New Recent urinary tract infections: no   Relieved by:  Phenazopyridine Worsened by:  Nothing tried Ineffective treatments:  None tried Urinary symptoms: frequent urination and hesitancy   Urinary symptoms: no discolored urine, no foul-smelling urine, no hematuria and no bladder incontinence   Associated symptoms: no abdominal pain, no fever, no flank pain, no nausea, no vaginal discharge and no vomiting     Past Medical History  Diagnosis Date  . Hyperlipidemia   . Hypertension   . Seizures (HCC)     disorder  . Asthma   . Complication of anesthesia     reports that sometimes she is hard to wake up other time she is not  . Type II diabetes mellitus (HCC)   . GERD (gastroesophageal reflux disease)     intermittent  . Arthritis   . History of kidney stones    Past Surgical History  Procedure Laterality Date  . Appendectomy    . Cesarean section    . Cholecystectomy    . Tubal ligation    . Abdominal hysterectomy      complete  . Kidney stones removed    . Lumbar laminectomy/decompression microdiscectomy N/A 07/11/2014    Procedure: MICRODISCECTOMY LUMBAR LAMINECTOMY -L3-L4 BILATERAL MICRODISCECTOMY ;  Surgeon: Eldred MangesMark C Yates, MD;  Location: [facility];  Service: Orthopedics;  Laterality: N/A;  . Back surgery     Family History   Problem Relation Age of Onset  . Hypertension Mother   . Heart attack Mother   . Stroke Father   . Kidney disease Father   . Cancer Sister     breast- in 1640's  . Diabetes Other     family hx of  . Hypertension Other     family hx of  . Cancer Other     prostate/ family hx of  . Cancer Sister 2166    colon  . Stroke Other   . Heart attack Other    Social History  Substance Use Topics  . Smoking status: Former Games developermoker  . Smokeless tobacco: Never Used     Comment: smoked as a teenager  . Alcohol Use: No   OB History    No data available     Review of Systems  Constitutional: Negative for fever.  Gastrointestinal: Negative for nausea, vomiting and abdominal pain.  Genitourinary: Positive for dysuria. Negative for flank pain and vaginal discharge.  All other systems reviewed and are negative.   Allergies  Penicillins  Home Medications   Prior to Admission medications   Medication Sig Start Date End Date Taking? Authorizing Provider  ACCU-CHEK AVIVA PLUS test strip  08/19/13   Historical Provider, MD  ACCU-CHEK FASTCLIX LANCETS MISC  07/01/13   Historical Provider, MD  aspirin 81 MG  tablet Take 81 mg by mouth daily.      Historical Provider, MD  atorvastatin (LIPITOR) 20 MG tablet Take 20 mg by mouth daily at 6 PM.  07/22/13   Historical Provider, MD  cholecalciferol (VITAMIN D) 1000 UNITS tablet Take 1,000 Units by mouth daily.      Historical Provider, MD  losartan (COZAAR) 25 MG tablet Take 25 mg by mouth daily.      Historical Provider, MD  nitrofurantoin, macrocrystal-monohydrate, (MACROBID) 100 MG capsule Take 1 capsule (100 mg total) by mouth 2 (two) times daily. Take with food. 01/28/16   Lattie Haw, MD  pioglitazone (ACTOS) 30 MG tablet Take 30 mg by mouth daily.  09/03/13   Historical Provider, MD  temazepam (RESTORIL) 15 MG capsule Take 15 mg by mouth at bedtime as needed for sleep.  09/03/13   Historical Provider, MD   Meds Ordered and Administered this  Visit  Medications - No data to display  BP 145/80 mmHg  Pulse 89  Temp(Src) 97.6 F (36.4 C) (Oral)  Ht  (1.626 m)  Wt 271 lb (122.925 kg)  BMI 46.49 kg/m2  SpO2 95% No data found.   Physical Exam Nursing notes and Vital Signs reviewed. Appearance:  Patient appears stated age, and in no acute distress.  Patient is obese (BMI 46.5) Eyes:  Pupils are equal, round, and reactive to light and accomodation.  Extraocular movement is intact.  Conjunctivae are not inflamed   Pharynx:  Normal; moist mucous membranes  Neck:  Supple.  No adenopathy Lungs:  Clear to auscultation.  Breath sounds are equal.  Moving air well. Heart:  Regular rate and rhythm without murmurs, rubs, or gallops.  Abdomen:  Nontender without masses or hepatosplenomegaly.  Bowel sounds are present.  No CVA or flank tenderness.  Extremities:  No edema.  Skin:  No rash present.     ED Course  Procedures none    Labs Reviewed  POCT URINALYSIS DIP (MANUAL ENTRY) - Abnormal; Notable for the following:    Color, UA straw (*)    Clarity, UA cloudy (*)    Blood, UA moderate (*)    Protein Ur, POC =100 (*)    Nitrite, UA Positive (*)    Leukocytes, UA large (3+) (*)    All other components within normal limits  URINE CULTURE      MDM   1. Cystitis    Urine culture pending. Begin Macrobid  BID for one week. Increase fluid intake. If symptoms become significantly worse during the night or over the weekend, proceed to the local emergency room.  Followup with Family Doctor in about 8 days for repeat urinalysis prior to surgery.    Lattie Haw, MD 01/31/16 [phone]

## 2016-01-30 ENCOUNTER — Telehealth: Payer: Self-pay | Admitting: *Deleted

## 2016-01-30 LAB — URINE CULTURE

## 2016-02-01 ENCOUNTER — Telehealth: Payer: Self-pay | Admitting: Emergency Medicine

## 2016-02-02 NOTE — Pre-Procedure Instructions (Signed)
Traci Sullivan  02/02/2016      [id number] - [place], [place] - [address] [facility] [address]  [place] [id number] Phone: [phone] Fax: [phone]    Your procedure is scheduled on Fri, Mar 31 @ 7:30 AM  [facility] Admitting at 5:30 AM  Call this number if you have problems the morning of surgery:  [phone]   Remember:  Do not eat food or drink liquids after midnight.  Take these medicines the morning of surgery with A SIP OF WATER Pain Pill(if needed)             Stop taking your Aspirin and Meloxicam. No Goody's,BC's,Aleve,Advil,Motrin,Ibuprofen,Fish Oil,or any Herbal Medications.     How to Manage Your Diabetes Before and After Surgery  Why is it important to control my blood sugar before and after surgery? . Improving blood sugar levels before and after surgery helps healing and can limit problems. . A way of improving blood sugar control is eating a healthy diet by: o  Eating less sugar and carbohydrates o  Increasing activity/exercise o  Talking with your doctor about reaching your blood sugar goals . High blood sugars (greater than 180 mg/dL) can raise your risk of infections and slow your recovery, so you will need to focus on controlling your diabetes during the weeks before surgery. . Make sure that the doctor who takes care of your diabetes knows about your planned surgery including the date and location.  How do I manage my blood sugar before surgery? . Check your blood sugar at least 4 times a day, starting 2 days before surgery, to make sure that the level is not too high or low. o Check your blood sugar the morning of your surgery when you wake up and every 2 hours until you get to the Short Stay unit. . If your blood sugar is less than 70 mg/dL, you will need to treat for low blood sugar: o Do not take insulin. o Treat a low blood sugar (less than 70 mg/dL) with  cup of  clear juice (cranberry or apple), 4 glucose tablets, OR glucose gel. o Recheck blood sugar in 15 minutes after treatment (to make sure it is greater than 70 mg/dL). If your blood sugar is not greater than 70 mg/dL on recheck, call [phone] for further instructions. . Report your blood sugar to the short stay nurse when you get to Short Stay.  . If you are admitted to the hospital after surgery: o Your blood sugar will be checked by the staff and you will probably be given insulin after surgery (instead of oral diabetes medicines) to make sure you have good blood sugar levels. o The goal for blood sugar control after surgery is 80-180 mg/dL.              WHAT DO I DO ABOUT MY DIABETES MEDICATION?   Marland Kitchen. Do not take oral diabetes medicines (pills) the morning of surgery.  .        .   . The day of surgery, do not take other diabetes injectables, including Byetta (exenatide), Bydureon (exenatide ER), Victoza (liraglutide), or Trulicity (dulaglutide).  . If your CBG is greater than 220 mg/dL, you may take  of your sliding scale (correction) dose of insulin.  Other Instructions:          Patient Signature:  Date:   Nurse  Signature:  Date:   Reviewed and Endorsed by [facility] Patient Education Committee, August 2015   Do not wear jewelry, make-up or nail polish.  Do not wear lotions, powders, or perfumes.  You may wear deodorant.  Do not shave 48 hours prior to surgery.    Do not bring valuables to the hospital.  [facility] is not responsible for any belongings or valuables.  Contacts, dentures or bridgework may not be worn into surgery.  Leave your suitcase in the car.  After surgery it may be brought to your room.  For patients admitted to the hospital, discharge time will be determined by your treatment team.  Patients discharged the day of surgery will not be allowed to drive home.    Special instructions:  [place] - Preparing for Surgery  Before  surgery, you can play an important role.  Because skin is not sterile, your skin needs to be as free of germs as possible.  You can reduce the number of germs on you skin by washing with CHG (chlorahexidine gluconate) soap before surgery.  CHG is an antiseptic cleaner which kills germs and bonds with the skin to continue killing germs even after washing.  Please DO NOT use if you have an allergy to CHG or antibacterial soaps.  If your skin becomes reddened/irritated stop using the CHG and inform your nurse when you arrive at Short Stay.  Do not shave (including legs and underarms) for at least 48 hours prior to the first CHG shower.  You may shave your face.  Please follow these instructions carefully:   1.  Shower with CHG Soap the night before surgery and the                                morning of Surgery.  2.  If you choose to wash your hair, wash your hair first as usual with your       normal shampoo.  3.  After you shampoo, rinse your hair and body thoroughly to remove the                      Shampoo.  4.  Use CHG as you would any other liquid soap.  You can apply chg directly       to the skin and wash gently with scrungie or a clean washcloth.  5.  Apply the CHG Soap to your body ONLY FROM THE NECK DOWN.        Do not use on open wounds or open sores.  Avoid contact with your eyes,       ears, mouth and genitals (private parts).  Wash genitals (private parts)       with your normal soap.  6.  Wash thoroughly, paying special attention to the area where your surgery        will be performed.  7.  Thoroughly rinse your body with warm water from the neck down.  8.  DO NOT shower/wash with your normal soap after using and rinsing off       the CHG Soap.  9.  Pat yourself dry with a clean towel.            10.  Wear clean pajamas.            11.  Place clean sheets on your bed the night of your first shower and do not  sleep with pets.  Day of Surgery  Do not apply any  lotions/deoderants the morning of surgery.  Please wear clean clothes to the hospital/surgery center.    Please read over the following fact sheets that you were given. Pain Booklet, Coughing and Deep Breathing, MRSA Information and Surgical Site Infection Prevention

## 2016-02-05 ENCOUNTER — Encounter (HOSPITAL_COMMUNITY): Payer: Self-pay

## 2016-02-05 ENCOUNTER — Encounter (HOSPITAL_COMMUNITY)
Admission: RE | Admit: 2016-02-05 | Discharge: 2016-02-05 | Disposition: A | Discharge status: Home / Self Care | Payer: Medicare Other | Source: Ambulatory Visit | Attending: Orthopaedic Surgery | Admitting: Orthopaedic Surgery

## 2016-02-05 DIAGNOSIS — Z01812 Encounter for preprocedural laboratory examination: Secondary | ICD-10-CM | POA: Insufficient documentation

## 2016-02-05 DIAGNOSIS — M5126 Other intervertebral disc displacement, lumbar region: Secondary | ICD-10-CM | POA: Diagnosis not present

## 2016-02-05 DIAGNOSIS — Z01818 Encounter for other preprocedural examination: Secondary | ICD-10-CM | POA: Diagnosis not present

## 2016-02-05 DIAGNOSIS — R9431 Abnormal electrocardiogram [ECG] [EKG]: Secondary | ICD-10-CM | POA: Diagnosis not present

## 2016-02-05 DIAGNOSIS — I10 Essential (primary) hypertension: Secondary | ICD-10-CM | POA: Diagnosis not present

## 2016-02-05 DIAGNOSIS — E119 Type 2 diabetes mellitus without complications: Secondary | ICD-10-CM | POA: Insufficient documentation

## 2016-02-05 HISTORY — DX: Reserved for inherently not codable concepts without codable children: IMO0001

## 2016-02-05 HISTORY — DX: Trigeminal neuralgia: G50.0

## 2016-02-05 HISTORY — DX: Panic disorder (episodic paroxysmal anxiety): F41.0

## 2016-02-05 HISTORY — DX: Depression, unspecified: F32.A

## 2016-02-05 HISTORY — DX: Major depressive disorder, single episode, unspecified: F32.9

## 2016-02-05 LAB — URINE MICROSCOPIC-ADD ON: RBC / HPF: NONE SEEN RBC/hpf (ref 0–5)

## 2016-02-05 LAB — CBC
HEMATOCRIT: 40.2 % (ref 36.0–46.0)
HEMOGLOBIN: 13.1 g/dL (ref 12.0–15.0)
MCH: 30.6 pg (ref 26.0–34.0)
MCHC: 32.6 g/dL (ref 30.0–36.0)
MCV: 93.9 fL (ref 78.0–100.0)
Platelets: 292 10*3/uL (ref 150–400)
RBC: 4.28 MIL/uL (ref 3.87–5.11)
RDW: 12.7 % (ref 11.5–15.5)
WBC: 6.3 10*3/uL (ref 4.0–10.5)

## 2016-02-05 LAB — GLUCOSE, CAPILLARY: GLUCOSE-CAPILLARY: 141 mg/dL — AB (ref 65–99)

## 2016-02-05 LAB — URINALYSIS, ROUTINE W REFLEX MICROSCOPIC
Bilirubin Urine: NEGATIVE
GLUCOSE, UA: NEGATIVE mg/dL
Hgb urine dipstick: NEGATIVE
Ketones, ur: NEGATIVE mg/dL
NITRITE: NEGATIVE
PH: 7.5 (ref 5.0–8.0)
Protein, ur: NEGATIVE mg/dL
SPECIFIC GRAVITY, URINE: 1.015 (ref 1.005–1.030)

## 2016-02-05 LAB — COMPREHENSIVE METABOLIC PANEL
ALBUMIN: 4.2 g/dL (ref 3.5–5.0)
ALK PHOS: 62 U/L (ref 38–126)
ALT: 21 U/L (ref 14–54)
ANION GAP: 11 (ref 5–15)
AST: 22 U/L (ref 15–41)
BILIRUBIN TOTAL: 0.5 mg/dL (ref 0.3–1.2)
BUN: 11 mg/dL (ref 6–20)
CALCIUM: 9.5 mg/dL (ref 8.9–10.3)
CO2: 26 mmol/L (ref 22–32)
CREATININE: 0.77 mg/dL (ref 0.44–1.00)
Chloride: 103 mmol/L (ref 101–111)
GFR calc Af Amer: >60 mL/min (ref >60)
GFR calc non Af Amer: >60 mL/min (ref >60)
GLUCOSE: 151 mg/dL — AB (ref 65–99)
Potassium: 4.5 mmol/L (ref 3.5–5.1)
Sodium: 140 mmol/L (ref 135–145)
TOTAL PROTEIN: 7.5 g/dL (ref 6.5–8.1)

## 2016-02-05 LAB — APTT: APTT: 31 s (ref 24–37)

## 2016-02-05 LAB — SURGICAL PCR SCREEN
MRSA, PCR: NEGATIVE
Staphylococcus aureus: NEGATIVE

## 2016-02-05 LAB — PROTIME-INR
INR: 1.01 (ref 0.00–1.49)
Prothrombin Time: 13.5 seconds (ref 11.6–15.2)

## 2016-02-05 NOTE — Progress Notes (Addendum)
Traci Sullivan denies chest pain.  Patient  reports shortness of breath with activity, "due to weight and lack of activity due to back pain." PCP is Dr Tresa EndoKelly in  [place], he also manages [facility].  "Last A1C was 7 , done about 10- months ago."  Traci Sullivan had a UTI, Ecoli in March, she will take last antibiotic today.  I called and asked if Dr Ophelia CharterYates would like a repeat UA and one was ordered.

## 2016-02-05 NOTE — Pre-Procedure Instructions (Signed)
Traci Sullivan  02/05/2016    Your procedure is scheduled on Fri, Mar 31 @ 7:30 AM  Report to [facility] Admitting at 5:30 AM  Call this number if you have problems the morning of surgery:[phone]   Remember:  Do not eat food or drink liquids after midnight.  Take these medicines the morning of surgery with A SIP OF WATER: carbamazepine (TEGRETOL); Pain Pill(if needed)             Stop taking your Aspirin and Meloxicam. No Goody's,BC's,Aleve,Advil,Motrin,Ibuprofen,Fish Oil,or any Herbal Medications.   How to Manage Your Diabetes Before and After Surgery  WHAT DO I DO ABOUT MY DIABETES MEDICATION? Marland Kitchen Do not take oral diabetes medicines (pills) the morning of surgery.  Why is it important to control my blood sugar before and after surgery ery? . Improving blood sugar levels before and after surgery helps healing and can limit problems. . A way of improving blood sugar control is eating a healthy diet by: o  Eating less sugar and carbohydrates o  Increasing activity/exercise o  Talking with your doctor about reaching your blood sugar goals . High blood sugars (greater than 180 mg/dL) can raise your risk of infections and slow your recovery, so you will need to focus on controlling your diabetes during the weeks before surgery. . Make sure that the doctor who takes care of your diabetes knows about your planned surgery including the date and location.  How do I manage my blood sugar before surgery? . Check your blood sugar at least 4 times a day, starting 2 days before surgery, to make sure that the level is not too high or low. o Check your blood sugar the morning of your surgery when you wake up and every 2 hours until you get to the Short Stay unit. . If your blood sugar is less than 70 mg/dL, you will need to treat for low blood sugar: o Do not take insulin. o Treat a low blood sugar (less than 70 mg/dL) with  cup of clear juice (cranberry or apple), 4 glucose  tablets, OR glucose gel. o Recheck blood sugar in 15 minutes after treatment (to make sure it is greater than 70 mg/dL). If your blood sugar is not greater than 70 mg/dL on recheck, call [phone] for further instructions. . Report your blood sugar to the short stay nurse when you get to Short Stay . If you are admitted to the hospital after surgery: o Your blood sugar will be checked by the staff and you will probably be given insulin after surgery (instead of oral diabetes medicines) to make sure you have good blood sugar levels. o The goal for blood sugar control after surgery is 80-180 mg/dL.  Patient's Signature:_____________________   Date:________________________________  Nurse Signature:  Date:     Do not wear jewelry, make-up or nail polish.  Do not wear lotions, powders, or perfumes.   Do not shave 48 hours prior to surgery.    Do not bring valuables to the hospital.  [facility] is not responsible for any belongings or valuables.  Contacts, dentures or bridgework may not be worn into surgery.  Leave your suitcase in the car.  After surgery it may be brought to your room.  For patients admitted to the hospital, discharge time will be determined by your treatment team.  Please read over the following fact sheets that you were given. Pain Booklet, Coughing and Deep Breathing, MRSA Information and  Surgical Site Infection Prevention, [place]- Preparing for Surgery

## 2016-02-06 LAB — HEMOGLOBIN A1C
HEMOGLOBIN A1C: 6.8 % — AB (ref 4.8–5.6)
Mean Plasma Glucose: 148 mg/dL

## 2016-02-08 NOTE — Anesthesia Preprocedure Evaluation (Addendum)
Anesthesia Evaluation  Patient identified by MRN, date of birth, ID band Patient awake    Reviewed: Allergy & Precautions, H&P , NPO status , Patient's Chart, lab work & pertinent test results  Airway Mallampati: I       Dental no notable dental hx. (+) Edentulous Upper, Edentulous Lower   Pulmonary asthma (clear today) , Sleep apnea: likely by BMI. , former smoker,    Pulmonary exam normal        Cardiovascular hypertension, Pt. on medications Normal cardiovascular exam     Neuro/Psych    GI/Hepatic GERD  Controlled,  Endo/Other  diabetes, Type 2, Oral Hypoglycemic AgentsMorbid obesity  Renal/GU      Musculoskeletal   Abdominal (+) + obese,  Abdomen: soft.    Peds  Hematology   Anesthesia Other Findings   Reproductive/Obstetrics                            Anesthesia Physical  Anesthesia Plan  ASA: III  Anesthesia Plan: General   Post-op Pain Management:    Induction: Intravenous  Airway Management Planned: Oral ETT  Additional Equipment:   Intra-op Plan:   Post-operative Plan: Extubation in OR  Informed Consent:   Dental advisory given  Plan Discussed with: CRNA and Surgeon  Anesthesia Plan Comments:        Anesthesia Quick Evaluation

## 2016-02-09 ENCOUNTER — Encounter (HOSPITAL_COMMUNITY)
Admission: AD | Disposition: A | Discharge status: Skilled Nursing Facility | Payer: Self-pay | Source: Ambulatory Visit | Attending: Orthopaedic Surgery

## 2016-02-09 ENCOUNTER — Ambulatory Visit (HOSPITAL_COMMUNITY): Payer: Medicare Other

## 2016-02-09 ENCOUNTER — Ambulatory Visit (HOSPITAL_COMMUNITY): Payer: Medicare Other | Admitting: Anesthesiology

## 2016-02-09 ENCOUNTER — Encounter (HOSPITAL_COMMUNITY): Payer: Self-pay | Admitting: Surgery

## 2016-02-09 ENCOUNTER — Inpatient Hospital Stay (HOSPITAL_COMMUNITY)
Admission: AD | Admit: 2016-02-09 | Discharge: 2016-02-10 | DRG: 520 | Disposition: A | Discharge status: Skilled Nursing Facility | Payer: Medicare Other | Source: Ambulatory Visit | Attending: Orthopaedic Surgery | Admitting: Orthopaedic Surgery

## 2016-02-09 DIAGNOSIS — E785 Hyperlipidemia, unspecified: Secondary | ICD-10-CM | POA: Diagnosis present

## 2016-02-09 DIAGNOSIS — J45909 Unspecified asthma, uncomplicated: Secondary | ICD-10-CM | POA: Diagnosis present

## 2016-02-09 DIAGNOSIS — E119 Type 2 diabetes mellitus without complications: Secondary | ICD-10-CM | POA: Diagnosis present

## 2016-02-09 DIAGNOSIS — Z419 Encounter for procedure for purposes other than remedying health state, unspecified: Secondary | ICD-10-CM

## 2016-02-09 DIAGNOSIS — Z881 Allergy status to other antibiotic agents status: Secondary | ICD-10-CM

## 2016-02-09 DIAGNOSIS — Z885 Allergy status to narcotic agent status: Secondary | ICD-10-CM | POA: Diagnosis not present

## 2016-02-09 DIAGNOSIS — Z791 Long term (current) use of non-steroidal anti-inflammatories (NSAID): Secondary | ICD-10-CM

## 2016-02-09 DIAGNOSIS — M5116 Intervertebral disc disorders with radiculopathy, lumbar region: Secondary | ICD-10-CM | POA: Diagnosis present

## 2016-02-09 DIAGNOSIS — G500 Trigeminal neuralgia: Secondary | ICD-10-CM | POA: Diagnosis present

## 2016-02-09 DIAGNOSIS — Z880 Allergy status to penicillin: Secondary | ICD-10-CM | POA: Diagnosis not present

## 2016-02-09 DIAGNOSIS — Z7984 Long term (current) use of oral hypoglycemic drugs: Secondary | ICD-10-CM | POA: Diagnosis not present

## 2016-02-09 DIAGNOSIS — I10 Essential (primary) hypertension: Secondary | ICD-10-CM | POA: Diagnosis not present

## 2016-02-09 DIAGNOSIS — Z7982 Long term (current) use of aspirin: Secondary | ICD-10-CM | POA: Diagnosis not present

## 2016-02-09 DIAGNOSIS — M4806 Spinal stenosis, lumbar region: Secondary | ICD-10-CM | POA: Diagnosis present

## 2016-02-09 DIAGNOSIS — Z87891 Personal history of nicotine dependence: Secondary | ICD-10-CM | POA: Diagnosis not present

## 2016-02-09 DIAGNOSIS — M5126 Other intervertebral disc displacement, lumbar region: Secondary | ICD-10-CM | POA: Diagnosis not present

## 2016-02-09 DIAGNOSIS — Z9889 Other specified postprocedural states: Secondary | ICD-10-CM

## 2016-02-09 DIAGNOSIS — K219 Gastro-esophageal reflux disease without esophagitis: Secondary | ICD-10-CM | POA: Diagnosis present

## 2016-02-09 DIAGNOSIS — M545 Low back pain: Secondary | ICD-10-CM | POA: Diagnosis present

## 2016-02-09 HISTORY — PX: LUMBAR LAMINECTOMY/DECOMPRESSION MICRODISCECTOMY: SHX5026

## 2016-02-09 LAB — GLUCOSE, CAPILLARY
GLUCOSE-CAPILLARY: 133 mg/dL — AB (ref 65–99)
Glucose-Capillary: 139 mg/dL — ABNORMAL HIGH (ref 65–99)
Glucose-Capillary: 155 mg/dL — ABNORMAL HIGH (ref 65–99)

## 2016-02-09 SURGERY — LUMBAR LAMINECTOMY/DECOMPRESSION MICRODISCECTOMY
Anesthesia: General

## 2016-02-09 MED ORDER — SODIUM CHLORIDE 0.9 % IV SOLN
250.0000 mL | INTRAVENOUS | Status: DC
  Administered 2016-02-09: 250 mL via INTRAVENOUS

## 2016-02-09 MED ORDER — HYDROMORPHONE HCL 1 MG/ML IJ SOLN
0.5000 mg | INTRAMUSCULAR | Status: DC | PRN
  Administered 2016-02-09: 1 mg via INTRAVENOUS
  Filled 2016-02-09: qty 1

## 2016-02-09 MED ORDER — DOCUSATE SODIUM 100 MG PO CAPS
100.0000 mg | ORAL_CAPSULE | Freq: Two times a day (BID) | ORAL | Status: DC
  Administered 2016-02-09 – 2016-02-10 (×3): 100 mg via ORAL
  Filled 2016-02-09 (×3): qty 1

## 2016-02-09 MED ORDER — CARBAMAZEPINE 200 MG PO TABS
200.0000 mg | ORAL_TABLET | Freq: Every day | ORAL | Status: DC
  Administered 2016-02-10: 200 mg via ORAL
  Filled 2016-02-09: qty 1

## 2016-02-09 MED ORDER — CARBAMAZEPINE 200 MG PO TABS
400.0000 mg | ORAL_TABLET | Freq: Every day | ORAL | Status: DC
  Administered 2016-02-09: 400 mg via ORAL
  Filled 2016-02-09: qty 2

## 2016-02-09 MED ORDER — SUCCINYLCHOLINE CHLORIDE 20 MG/ML IJ SOLN
INTRAMUSCULAR | Status: AC
  Filled 2016-02-09: qty 1

## 2016-02-09 MED ORDER — ARTIFICIAL TEARS OP OINT
TOPICAL_OINTMENT | OPHTHALMIC | Status: AC
  Filled 2016-02-09: qty 3.5

## 2016-02-09 MED ORDER — PROPOFOL 10 MG/ML IV BOLUS
INTRAVENOUS | Status: DC | PRN
  Administered 2016-02-09: 200 mg via INTRAVENOUS

## 2016-02-09 MED ORDER — ATORVASTATIN CALCIUM 20 MG PO TABS
20.0000 mg | ORAL_TABLET | Freq: Every day | ORAL | Status: DC
  Administered 2016-02-09: 20 mg via ORAL
  Filled 2016-02-09: qty 1

## 2016-02-09 MED ORDER — SODIUM CHLORIDE 0.9% FLUSH: 3.0000 mL | INTRAVENOUS | Status: DC | PRN

## 2016-02-09 MED ORDER — OXYCODONE-ACETAMINOPHEN 5-325 MG PO TABS: 1.0000 | ORAL_TABLET | Freq: Four times a day (QID) | ORAL | Status: DC | PRN

## 2016-02-09 MED ORDER — ROCURONIUM BROMIDE 50 MG/5ML IV SOLN
INTRAVENOUS | Status: AC
  Filled 2016-02-09: qty 1

## 2016-02-09 MED ORDER — ARTIFICIAL TEARS OP OINT
TOPICAL_OINTMENT | OPHTHALMIC | Status: DC | PRN
  Administered 2016-02-09: 1 via OPHTHALMIC

## 2016-02-09 MED ORDER — VANCOMYCIN HCL 10 G IV SOLR
1500.0000 mg | Freq: Once | INTRAVENOUS | Status: AC
  Administered 2016-02-09: 1500 mg via INTRAVENOUS
  Filled 2016-02-09: qty 1500

## 2016-02-09 MED ORDER — ACETAMINOPHEN 650 MG RE SUPP: 650.0000 mg | RECTAL | Status: DC | PRN

## 2016-02-09 MED ORDER — PROPOFOL 10 MG/ML IV BOLUS
INTRAVENOUS | Status: AC
  Filled 2016-02-09: qty 20

## 2016-02-09 MED ORDER — LACTATED RINGERS IV SOLN
INTRAVENOUS | Status: DC | PRN
  Administered 2016-02-09 (×2): via INTRAVENOUS

## 2016-02-09 MED ORDER — FENTANYL CITRATE (PF) 100 MCG/2ML IJ SOLN
25.0000 ug | INTRAMUSCULAR | Status: DC | PRN
  Administered 2016-02-09 (×3): 50 ug via INTRAVENOUS

## 2016-02-09 MED ORDER — NEOSTIGMINE METHYLSULFATE 10 MG/10ML IV SOLN
INTRAVENOUS | Status: AC
  Filled 2016-02-09: qty 1

## 2016-02-09 MED ORDER — SODIUM CHLORIDE 0.45 % IV SOLN: INTRAVENOUS | Status: DC

## 2016-02-09 MED ORDER — PHENOL 1.4 % MT LIQD: 1.0000 | OROMUCOSAL | Status: DC | PRN

## 2016-02-09 MED ORDER — GLIPIZIDE ER 2.5 MG PO TB24
2.5000 mg | ORAL_TABLET | Freq: Every day | ORAL | Status: DC
  Administered 2016-02-09 – 2016-02-10 (×2): 2.5 mg via ORAL
  Filled 2016-02-09 (×3): qty 1

## 2016-02-09 MED ORDER — IBUPROFEN 100 MG/5ML PO SUSP: 200.0000 mg | Freq: Four times a day (QID) | ORAL | Status: DC | PRN

## 2016-02-09 MED ORDER — BUPIVACAINE HCL (PF) 0.25 % IJ SOLN
INTRAMUSCULAR | Status: AC
  Filled 2016-02-09: qty 30

## 2016-02-09 MED ORDER — BUPIVACAINE HCL (PF) 0.25 % IJ SOLN
INTRAMUSCULAR | Status: DC | PRN
  Administered 2016-02-09: 10 mL

## 2016-02-09 MED ORDER — KETOROLAC TROMETHAMINE 30 MG/ML IJ SOLN: 30.0000 mg | Freq: Once | INTRAMUSCULAR | Status: DC

## 2016-02-09 MED ORDER — MENTHOL 3 MG MT LOZG: 1.0000 | LOZENGE | OROMUCOSAL | Status: DC | PRN

## 2016-02-09 MED ORDER — CHLORHEXIDINE GLUCONATE 4 % EX LIQD: 60.0000 mL | Freq: Once | CUTANEOUS | Status: DC

## 2016-02-09 MED ORDER — FENTANYL CITRATE (PF) 250 MCG/5ML IJ SOLN
INTRAMUSCULAR | Status: AC
  Filled 2016-02-09: qty 5

## 2016-02-09 MED ORDER — FENTANYL CITRATE (PF) 100 MCG/2ML IJ SOLN
INTRAMUSCULAR | Status: AC
  Filled 2016-02-09: qty 2

## 2016-02-09 MED ORDER — 0.9 % SODIUM CHLORIDE (POUR BTL) OPTIME
TOPICAL | Status: DC | PRN
  Administered 2016-02-09: 1000 mL

## 2016-02-09 MED ORDER — ONDANSETRON HCL 4 MG/2ML IJ SOLN
4.0000 mg | Freq: Once | INTRAMUSCULAR | Status: AC | PRN
  Administered 2016-02-09: 4 mg via INTRAVENOUS

## 2016-02-09 MED ORDER — HYDROCODONE-ACETAMINOPHEN 7.5-325 MG PO TABS: 1.0000 | ORAL_TABLET | Freq: Once | ORAL | Status: DC | PRN

## 2016-02-09 MED ORDER — ROCURONIUM BROMIDE 100 MG/10ML IV SOLN
INTRAVENOUS | Status: DC | PRN
  Administered 2016-02-09: 10 mg via INTRAVENOUS
  Administered 2016-02-09: 50 mg via INTRAVENOUS
  Administered 2016-02-09: 10 mg via INTRAVENOUS

## 2016-02-09 MED ORDER — CLINDAMYCIN PHOSPHATE 900 MG/50ML IV SOLN: 900.0000 mg | INTRAVENOUS | Status: DC

## 2016-02-09 MED ORDER — PHENYLEPHRINE 40 MCG/ML (10ML) SYRINGE FOR IV PUSH (FOR BLOOD PRESSURE SUPPORT)
PREFILLED_SYRINGE | INTRAVENOUS | Status: AC
  Filled 2016-02-09: qty 10

## 2016-02-09 MED ORDER — FENTANYL CITRATE (PF) 100 MCG/2ML IJ SOLN
INTRAMUSCULAR | Status: DC | PRN
  Administered 2016-02-09 (×2): 50 ug via INTRAVENOUS
  Administered 2016-02-09: 200 ug via INTRAVENOUS

## 2016-02-09 MED ORDER — GLYCOPYRROLATE 0.2 MG/ML IJ SOLN
INTRAMUSCULAR | Status: DC | PRN
  Administered 2016-02-09: 0.6 mg via INTRAVENOUS

## 2016-02-09 MED ORDER — GLYCOPYRROLATE 0.2 MG/ML IJ SOLN
INTRAMUSCULAR | Status: AC
  Filled 2016-02-09: qty 3

## 2016-02-09 MED ORDER — LIDOCAINE HCL (CARDIAC) 20 MG/ML IV SOLN
INTRAVENOUS | Status: DC | PRN
  Administered 2016-02-09: 100 mg via INTRAVENOUS

## 2016-02-09 MED ORDER — CLINDAMYCIN PHOSPHATE 900 MG/50ML IV SOLN
INTRAVENOUS | Status: AC
  Administered 2016-02-09: 900 mg via INTRAVENOUS
  Filled 2016-02-09: qty 50

## 2016-02-09 MED ORDER — POLYETHYLENE GLYCOL 3350 17 G PO PACK: 17.0000 g | PACK | Freq: Every day | ORAL | Status: DC | PRN

## 2016-02-09 MED ORDER — PROMETHAZINE HCL 25 MG/ML IJ SOLN
12.5000 mg | INTRAMUSCULAR | Status: DC | PRN
  Administered 2016-02-09: 12.5 mg via INTRAVENOUS
  Filled 2016-02-09 (×2): qty 1

## 2016-02-09 MED ORDER — ONDANSETRON HCL 4 MG/2ML IJ SOLN
INTRAMUSCULAR | Status: AC
  Filled 2016-02-09: qty 2

## 2016-02-09 MED ORDER — LOSARTAN POTASSIUM 25 MG PO TABS
25.0000 mg | ORAL_TABLET | Freq: Every day | ORAL | Status: DC
  Administered 2016-02-09 – 2016-02-10 (×2): 25 mg via ORAL
  Filled 2016-02-09 (×2): qty 1

## 2016-02-09 MED ORDER — MEPERIDINE HCL 25 MG/ML IJ SOLN: 6.2500 mg | INTRAMUSCULAR | Status: DC | PRN

## 2016-02-09 MED ORDER — PIOGLITAZONE HCL 30 MG PO TABS
30.0000 mg | ORAL_TABLET | Freq: Every day | ORAL | Status: DC
  Administered 2016-02-09 – 2016-02-10 (×2): 30 mg via ORAL
  Filled 2016-02-09 (×3): qty 1

## 2016-02-09 MED ORDER — SODIUM CHLORIDE 0.9% FLUSH
3.0000 mL | Freq: Two times a day (BID) | INTRAVENOUS | Status: DC
  Administered 2016-02-09 (×2): 3 mL via INTRAVENOUS

## 2016-02-09 MED ORDER — TEMAZEPAM 15 MG PO CAPS
30.0000 mg | ORAL_CAPSULE | Freq: Every evening | ORAL | Status: DC | PRN
  Administered 2016-02-09: 15 mg via ORAL
  Filled 2016-02-09 (×3): qty 1

## 2016-02-09 MED ORDER — LIDOCAINE HCL (CARDIAC) 20 MG/ML IV SOLN
INTRAVENOUS | Status: AC
  Filled 2016-02-09: qty 5

## 2016-02-09 MED ORDER — ONDANSETRON HCL 4 MG/2ML IJ SOLN
INTRAMUSCULAR | Status: DC | PRN
  Administered 2016-02-09: 4 mg via INTRAVENOUS

## 2016-02-09 MED ORDER — ONDANSETRON HCL 4 MG/2ML IJ SOLN
INTRAMUSCULAR | Status: AC
  Administered 2016-02-09: 4 mg via INTRAVENOUS
  Filled 2016-02-09: qty 2

## 2016-02-09 MED ORDER — OXYCODONE-ACETAMINOPHEN 5-325 MG PO TABS
1.0000 | ORAL_TABLET | Freq: Four times a day (QID) | ORAL | Status: DC | PRN
  Administered 2016-02-09 – 2016-02-10 (×2): 2 via ORAL
  Filled 2016-02-09 (×3): qty 2

## 2016-02-09 MED ORDER — SUCCINYLCHOLINE CHLORIDE 20 MG/ML IJ SOLN
INTRAMUSCULAR | Status: DC | PRN
  Administered 2016-02-09: 140 mg via INTRAVENOUS

## 2016-02-09 MED ORDER — ACETAMINOPHEN 325 MG PO TABS
650.0000 mg | ORAL_TABLET | ORAL | Status: DC | PRN
  Administered 2016-02-10: 650 mg via ORAL
  Filled 2016-02-09: qty 2

## 2016-02-09 MED ORDER — NEOSTIGMINE METHYLSULFATE 10 MG/10ML IV SOLN
INTRAVENOUS | Status: DC | PRN
  Administered 2016-02-09: 4 mg via INTRAVENOUS

## 2016-02-09 MED ORDER — ONDANSETRON HCL 4 MG/2ML IJ SOLN
4.0000 mg | INTRAMUSCULAR | Status: DC | PRN
  Administered 2016-02-09 (×2): 4 mg via INTRAVENOUS
  Filled 2016-02-09 (×2): qty 2

## 2016-02-09 MED ORDER — PHENYLEPHRINE HCL 10 MG/ML IJ SOLN
INTRAMUSCULAR | Status: DC | PRN
  Administered 2016-02-09: 40 ug via INTRAVENOUS
  Administered 2016-02-09: 80 ug via INTRAVENOUS
  Administered 2016-02-09: 40 ug via INTRAVENOUS
  Administered 2016-02-09 (×2): 80 ug via INTRAVENOUS
  Administered 2016-02-09 (×2): 40 ug via INTRAVENOUS

## 2016-02-09 MED ORDER — IBUPROFEN 200 MG PO TABS: 200.0000 mg | ORAL_TABLET | Freq: Four times a day (QID) | ORAL | Status: DC | PRN

## 2016-02-09 SURGICAL SUPPLY — 47 items
ADH SKN CLS APL DERMABOND .7 (GAUZE/BANDAGES/DRESSINGS) ×1
BUR ROUND FLUTED 4 SOFT TCH (BURR) ×1 IMPLANT
BUR ROUND FLUTED 4MM SOFT TCH (BURR) ×1
CLOSURE STERI-STRIP 1/2X4 (GAUZE/BANDAGES/DRESSINGS) ×1
CLSR STERI-STRIP ANTIMIC 1/2X4 (GAUZE/BANDAGES/DRESSINGS) ×2 IMPLANT
COVER SURGICAL LIGHT HANDLE (MISCELLANEOUS) ×3 IMPLANT
DERMABOND ADVANCED (GAUZE/BANDAGES/DRESSINGS) ×2
DERMABOND ADVANCED .7 DNX12 (GAUZE/BANDAGES/DRESSINGS) ×1 IMPLANT
DRAPE MICROSCOPE LEICA (MISCELLANEOUS) ×3 IMPLANT
DRAPE PROXIMA HALF (DRAPES) ×6 IMPLANT
DRSG MEPILEX BORDER 4X4 (GAUZE/BANDAGES/DRESSINGS) ×1 IMPLANT
DRSG MEPILEX BORDER 4X8 (GAUZE/BANDAGES/DRESSINGS) ×3 IMPLANT
DURAPREP 26ML APPLICATOR (WOUND CARE) ×3 IMPLANT
ELECT REM PT RETURN 9FT ADLT (ELECTROSURGICAL) ×3
ELECTRODE REM PT RTRN 9FT ADLT (ELECTROSURGICAL) ×1 IMPLANT
GLOVE BIOGEL PI IND STRL 8 (GLOVE) ×2 IMPLANT
GLOVE BIOGEL PI INDICATOR 8 (GLOVE) ×4
GLOVE ORTHO TXT STRL SZ7.5 (GLOVE) ×6 IMPLANT
GOWN STRL REUS W/ TWL LRG LVL3 (GOWN DISPOSABLE) ×2 IMPLANT
GOWN STRL REUS W/ TWL XL LVL3 (GOWN DISPOSABLE) ×1 IMPLANT
GOWN STRL REUS W/TWL 2XL LVL3 (GOWN DISPOSABLE) ×3 IMPLANT
GOWN STRL REUS W/TWL LRG LVL3 (GOWN DISPOSABLE) ×6
GOWN STRL REUS W/TWL XL LVL3 (GOWN DISPOSABLE) ×3
KIT BASIN OR (CUSTOM PROCEDURE TRAY) ×3 IMPLANT
KIT ROOM TURNOVER OR (KITS) ×3 IMPLANT
MANIFOLD NEPTUNE II (INSTRUMENTS) ×3 IMPLANT
MATRIX HEMOSTAT SURGIFLO (HEMOSTASIS) ×2 IMPLANT
NDL HYPO 25GX1X1/2 BEV (NEEDLE) ×1 IMPLANT
NDL SPNL 18GX3.5 QUINCKE PK (NEEDLE) ×1 IMPLANT
NEEDLE HYPO 25GX1X1/2 BEV (NEEDLE) ×3 IMPLANT
NEEDLE SPNL 18GX3.5 QUINCKE PK (NEEDLE) ×3 IMPLANT
NS IRRIG 1000ML POUR BTL (IV SOLUTION) ×3 IMPLANT
PACK LAMINECTOMY ORTHO (CUSTOM PROCEDURE TRAY) ×3 IMPLANT
PAD ARMBOARD 7.5X6 YLW CONV (MISCELLANEOUS) ×6 IMPLANT
PATTIES SURGICAL .5 X.5 (GAUZE/BANDAGES/DRESSINGS) IMPLANT
PATTIES SURGICAL .75X.75 (GAUZE/BANDAGES/DRESSINGS) IMPLANT
SPONGE LAP 4X18 X RAY DECT (DISPOSABLE) ×2 IMPLANT
SUT BONE WAX W31G (SUTURE) IMPLANT
SUT VIC AB 0 CT1 27 (SUTURE) ×3
SUT VIC AB 0 CT1 27XBRD ANBCTR (SUTURE) ×1 IMPLANT
SUT VIC AB 1 CTX 27 (SUTURE) ×2 IMPLANT
SUT VIC AB 2-0 CT1 27 (SUTURE) ×3
SUT VIC AB 2-0 CT1 TAPERPNT 27 (SUTURE) ×1 IMPLANT
SUT VIC AB 3-0 X1 27 (SUTURE) IMPLANT
TOWEL OR 17X24 6PK STRL BLUE (TOWEL DISPOSABLE) ×3 IMPLANT
TOWEL OR 17X26 10 PK STRL BLUE (TOWEL DISPOSABLE) ×3 IMPLANT
WATER STERILE IRR 1000ML POUR (IV SOLUTION) ×3 IMPLANT

## 2016-02-09 NOTE — Anesthesia Postprocedure Evaluation (Signed)
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

## 2016-02-09 NOTE — Discharge Summary (Signed)
Patient ID: Traci Sullivan MRN: [id number] DOB/AGE: 05/14/1946 70 y.o.  Admit date: 02/09/2016 Discharge date: 02/09/2016  Admission Diagnoses:  Active Problems:   History of lumbar laminectomy for spinal cord decompression   Discharge Diagnoses:  Active Problems:   History of lumbar laminectomy for spinal cord decompression  status post Procedure(s): Right 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  Past Medical History  Diagnosis Date  . Hyperlipidemia   . Hypertension   . Asthma   . Type II diabetes mellitus (HCC)   . GERD (gastroesophageal reflux disease)     intermittent  . Arthritis   . History of kidney stones   . Complication of anesthesia     reports that sometimes she is hard to wake up other time she is not.  2015 Panic attack and continued to have them for 6 weeks post  op.  . Shortness of breath dyspnea     with activity- "tying shoes'  X"Due to weight gain"  . Panic attacks 2015    awaking from anesthesia and for 6 weeks after.  Ocassionaly feels painc  . Depression     "husband died 17-May-2015"  . Trigeminal neuralgia     Surgeries: Procedure(s): Right 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 on 02/09/2016   Consultants:    Discharged Condition: Improved  Hospital Course: Traci Sullivan is an 70 y.o. female who was admitted 02/09/2016 for operative treatment of lumbar stenosis/hnp.  Patient failed conservative treatments (please see the history and physical for the specifics) and had severe unremitting pain that affects sleep, daily activities and work/hobbies. After pre-op clearance, the patient was taken to the operating room on 02/09/2016 and underwent  Procedure(s): Right 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.    Patient was given perioperative antibiotics: Anti-infectives    Start     Dose/Rate Route Frequency Ordered Stop   02/09/16 0535  clindamycin (CLEOCIN) 900 MG/50ML IVPB     Comments:  [place] [facility]   : cabinet override      02/09/16 0535 02/09/16 0750   02/09/16 0525  clindamycin (CLEOCIN) IVPB 900 mg  Status:  Discontinued     900 mg 100 mL/hr over 30 Minutes Intravenous On call to O.R. 02/09/16 0525 02/09/16 1337       Patient was given sequential compression devices and early ambulation to prevent DVT.   Patient benefited maximally from hospital stay and there were no complications. At the time of discharge, the patient was urinating/moving their bowels without difficulty, tolerating a regular diet, pain is controlled with oral pain medications and they have been cleared by PT/OT.   Recent vital signs: Patient Vitals for the past 24 hrs:  BP Temp Temp src Pulse Resp SpO2  02/09/16 1315 (!) 111/98 mmHg 97.6 F (36.4 C) - 80 13 96 %  02/09/16 1300 (!) 114/49 mmHg - - 76 14 99 %  02/09/16 1245 - - - 73 13 98 %  02/09/16 1240 (!) 110/52 mmHg - - 73 12 98 %  02/09/16 1230 - - - 77 14 94 %  02/09/16 1225 (!) 119/105 mmHg - - 77 (!) 22 97 %  02/09/16 1215 - - - 76 (!) 22 98 %  02/09/16 1210 115/68 mmHg - - 77 18 98 %  02/09/16 1200 - - - 76 14 100 %  02/09/16 1155 (!) 91/37 mmHg - - 76 15 100 %  02/09/16 1145 - - - 72 15 100 %  02/09/16 1140 124/63 mmHg - - 70 15 100 %  02/09/16 1130 - - - 69 13 95 %  02/09/16 1126 (!) 108/48 mmHg - - 73 19 100 %  02/09/16 1115 - - - 73 15 92 %  02/09/16 1110 (!) 123/58 mmHg - - 67 14 100 %  02/09/16 1106 - - - 67 12 100 %  02/09/16 1100 - - - 69 14 97 %  02/09/16 1055 125/60 mmHg - - 69 12 98 %  02/09/16 1045 - - - 71 16 97 %  02/09/16 1040 (!) 125/59 mmHg - - 75 17 98 %  02/09/16 1030 - - - 79 16 99 %  02/09/16 1025 (!) 143/70 mmHg 97.8 F (36.6 C) - 86 11 100 %  02/09/16 0613 (!) 151/53 mmHg 97.8 F (36.6 C) Oral 71 20 99 %     Recent laboratory studies: No results for input(s): WBC, HGB, HCT, PLT, NA, K, CL, CO2, BUN, CREATININE, GLUCOSE, INR, CALCIUM in the last 72 hours.  Invalid input(s): PT,  2   Discharge Medications:     Medication List    STOP taking these medications        HYDROcodone-acetaminophen 5-325 MG tablet  Commonly known as:  NORCO/VICODIN     meloxicam 15 MG tablet  Commonly known as:  MOBIC      TAKE these medications        aspirin 81 MG tablet  Take 81 mg by mouth daily.     atorvastatin 20 MG tablet  Commonly known as:  LIPITOR  Take 20 mg by mouth daily at 6 PM.     carbamazepine 200 MG tablet  Commonly known as:  TEGRETOL  Take 1-2 tablets by mouth 2 (two) times daily. 1 tab daily, 2 tabs at bedtime     cholecalciferol 1000 units tablet  Commonly known as:  VITAMIN D  Take 1,000 Units by mouth daily.     glipiZIDE 2.5 MG 24 hr tablet  Commonly known as:  GLUCOTROL XL  Take 1 tablet by mouth daily.     losartan 25 MG tablet  Commonly known as:  COZAAR  Take 25 mg by mouth daily.     oxyCODONE-acetaminophen 5-325 MG tablet  Commonly known as:  PERCOCET/ROXICET  Take 1-2 tablets by mouth every 6 (six) hours as needed for moderate pain.     pioglitazone 30 MG tablet  Commonly known as:  ACTOS  Take 30 mg by mouth daily.     temazepam 30 MG capsule  Commonly known as:  RESTORIL  Take 30 mg by mouth at bedtime as needed for sleep.        Diagnostic Studies: Dg Lumbar Spine 2-3 Views  02/09/2016  CLINICAL DATA:  Right L3-4 microdiscectomy. EXAM: LUMBAR SPINE - 2-3 VIEW COMPARISON:  None. FINDINGS: Three intraoperative images of the lumbar spine are submitted. The L3-4 disc space is localized on each image. IMPRESSION: Intraoperative localization of the L3-4 disc space. Electronically Signed   By: Marin Robertshristopher  Mattern M.D.   On: 02/09/2016 09:59   Mr Lumbar Spine W Wo Contrast  01/22/2016  CLINICAL DATA:  70 year old female with lumbar back pain. Query recurrent right L3-L4 disc with stenosis. Previous lumbar surgery. Subsequent encounter. EXAM: MRI LUMBAR SPINE WITHOUT AND WITH CONTRAST TECHNIQUE: Multiplanar and multiecho pulse  sequences of the lumbar spine were obtained without and with intravenous contrast. CONTRAST:  20mL MULTIHANCE GADOBENATE DIMEGLUMINE 529 MG/ML IV SOLN COMPARISON:  Intraoperative lumbar images  07/11/2014. Lumbar MRI 04/13/2014. FINDINGS: Same numbering system as on the 2015 MRI designating normal lumbar segmentation. Chronic retrolisthesis of L5 on S1 is stable. Mild retrolisthesis at L3-L4 appears stable. Interval postoperative changes to L3-L4, see details below. There is mild right far lateral endplate marrow edema at L3-L4 which appears to be degenerative in nature. No other acute osseous abnormality identified. Visualized lower thoracic spinal cord is normal with conus medularis at L1-L2. No abnormal intradural enhancement. Stable and negative visualized abdominal viscera. Mild postoperative changes to the posterior paraspinal soft tissues at L3-L4. T11-T12: Small chronic central disc extrusion appears stable with narrowing of the ventral CSF space but no spinal stenosis. T12-L1:  Negative. L1-L2: Mild facet hypertrophy and facet joint fluid is stable. No stenosis. L2-L3: Minimal disc bulge. Mild facet hypertrophy and bilateral facet joint fluid is stable. No stenosis. L3-L4: Interval laminectomy. Interval disc space loss. Bulky and lobulated rightward disc extrusion with some associated posterior L3 and L4 endplate Schmorl's node development. Extruded disc with rim enhancement encompassing about 16 mm CC by 15 by 25 mm (AP by transverse) results in severe right lateral recess and moderate to severe right foraminal stenosis at this level. Decompressed spinal canal with mild to moderate residual spinal stenosis (series 5, image 19). Moderate residual facet hypertrophy with right greater than left facet joint fluid. Underlying circumferential disc bulge. Mild left L3 foraminal stenosis. L4-L5: Chronic circumferential disc bulge. Mildly progressed superimposed central to right paracentral small disc extrusion  (series 5, image 24). Moderate facet and ligament flavum hypertrophy mildly increased. Trace facet joint fluid mildly increased. Epidural lipomatosis. Borderline to mild spinal stenosis. L5-S1: Chronic disc space loss with left eccentric circumferential disc osteophyte complex. Mild to moderate facet and ligament flavum hypertrophy has increased on the left. Mild epidural lipomatosis. No spinal or lateral recess stenosis. Stable mild bilateral L5 foraminal stenosis. IMPRESSION: 1. Previous L3-L4 laminectomy with recurrent bulky and lobulated rightward disc herniation. Severe right lateral recess stenosis. Moderate to severe right L3 foraminal stenosis and mild to moderate spinal stenosis. 2. Mild progression of chronic L4-L5 and L5-S1 degeneration but no other new or increased stenosis since 2015. Electronically Signed   By: Odessa Fleming M.D.   On: 01/22/2016 14:42        Discharge Instructions    Call MD / Call 911    Complete by:  As directed   If you experience chest pain or shortness of breath, CALL 911 and be transported to the hospital emergency room.  If you develope a fever above 101 F, pus (white drainage) or increased drainage or redness at the wound, or calf pain, call your surgeon's office.     Constipation Prevention    Complete by:  As directed   Drink plenty of fluids.  Prune juice may be helpful.  You may use a stool softener, such as Colace (over the counter) 100 mg twice a day.  Use MiraLax (over the counter) for constipation as needed.     Diet - low sodium heart healthy    Complete by:  As directed      Discharge instructions    Complete by:  As directed   Ok to shower 5 days postop.  Do not apply any creams or ointments to incision.  Do not remove steri-strips.  Can use 4x4 gauze and tape for dressing changes.  No aggressive activity.  No bending, squatting or prolonged sitting.  Mostly be in reclined position or lying down.  Driving restrictions    Complete by:  As directed   No  driving     Increase activity slowly as tolerated    Complete by:  As directed      Lifting restrictions    Complete by:  As directed   No lifting           Follow-up Information    Schedule an appointment as soon as possible for a visit with Eldred Manges, MD.   Specialty:  Orthopedic Surgery   Why:  need return office visit one week postop   Contact information:   [address] [place] [address] [phone]       Discharge Plan:  discharge to snf  Disposition:     Signed: Naida Sleight for Annell Greening MD [facility] orthopedics [phone]  02/09/2016, 1:59 PM

## 2016-02-09 NOTE — Transfer of Care (Signed)
Immediate Anesthesia Transfer of Care Note  Patient: Traci Sullivan  Procedure(s) Performed: Procedure(s): Right 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 (N/A)  Patient Location: PACU  Anesthesia Type:General  Level of Consciousness: awake, alert  and oriented  Airway & Oxygen Therapy: Patient Spontanous Breathing and Patient connected to face mask oxygen  Post-op Assessment: Report given to RN, Post -op Vital signs reviewed and stable and Patient moving all extremities X 4  Post vital signs: Reviewed and stable  Last Vitals:  Filed Vitals:   02/09/16 0613  BP: 151/53  Pulse: 71  Temp: 36.6 C  Resp: 20    Complications: No apparent anesthesia complications

## 2016-02-09 NOTE — Interval H&P Note (Signed)
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 After consideration of risks, benefits and other options for treatment, the patient has consented to  Procedure(s): Right 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 (N/A) as a surgical intervention .  The patient's history has been reviewed, patient examined, no change in status, stable for surgery.  I have reviewed the patient's chart and labs.  Questions were answered to the patient's satisfaction.     YATES,MARK C

## 2016-02-09 NOTE — H&P (Signed)
Traci Sullivan is an 70 y.o. female.   Chief Complaint:  Low back pain and right le radiculopathy HPI:  Patient presents with the above complaint.  Mri showed right L3-4 HNP.  Progressively worsening symptoms.  Failed conservative treatment.   Past Medical History  Diagnosis Date  . Hyperlipidemia   . Hypertension   . Asthma   . Type II diabetes mellitus (HCC)   . GERD (gastroesophageal reflux disease)     intermittent  . Arthritis   . History of kidney stones   . Complication of anesthesia     reports that sometimes she is hard to wake up other time she is not.  2015 Panic attack and continued to have them for 6 weeks post  op.  . Shortness of breath dyspnea     with activity- "tying shoes'  X"Due to weight gain"  . Panic attacks 2015    awaking from anesthesia and for 6 weeks after.  Ocassionaly feels painc  . Depression     "husband died July 2016"  . Trigeminal neuralgia     Past Surgical History  Procedure Laterality Date  . Appendectomy    . Cesarean section    . Cholecystectomy    . Tubal ligation    . Abdominal hysterectomy      complete  . Kidney stones removed    . Lumbar laminectomy/decompression microdiscectomy N/A 07/11/2014    Procedure: MICRODISCECTOMY LUMBAR LAMINECTOMY -L3-L4 BILATERAL MICRODISCECTOMY ;  Surgeon: Eldred MangesMark C Yates, MD;  Location: [facility] OR;  Service: Orthopedics;  Laterality: N/A;    Family History  Problem Relation Age of Onset  . Hypertension Mother   . Heart attack Mother   . Stroke Father   . Kidney disease Father   . Cancer Sister     breast- in 4740's  . Diabetes Other     family hx of  . Hypertension Other     family hx of  . Cancer Other     prostate/ family hx of  . Cancer Sister [id number]    colon  . Stroke Other   . Heart attack Other    Social History:  reports that she has quit smoking. She has never used smokeless tobacco. She reports that she does not drink alcohol or use illicit drugs.  Allergies:  Allergies  Allergen  Reactions  . Codeine Nausea Only  . Doxycycline Nausea Only  . Penicillins     REACTION: Splotches    Medications Prior to Admission  Medication Sig Dispense Refill  . aspirin 81 MG tablet Take 81 mg by mouth daily.      Marland Kitchen. atorvastatin (LIPITOR) 20 MG tablet Take 20 mg by mouth daily at 6 PM.     . carbamazepine (TEGRETOL) 200 MG tablet Take 1-2 tablets by mouth 2 (two) times daily. 1 tab daily, 2 tabs at bedtime  3  . cholecalciferol (VITAMIN D) 1000 UNITS tablet Take 1,000 Units by mouth daily.      Marland Kitchen. glipiZIDE (GLUCOTROL XL) 2.5 MG 24 hr tablet Take 1 tablet by mouth daily.  0  . HYDROcodone-acetaminophen (NORCO/VICODIN) 5-325 MG tablet Take 1 tablet by mouth every 4 (four) hours as needed for severe pain.     Marland Kitchen. losartan (COZAAR) 25 MG tablet Take 25 mg by mouth daily.      . meloxicam (MOBIC) 15 MG tablet Take 15 mg by mouth daily.    . pioglitazone (ACTOS) 30 MG tablet Take 30 mg by mouth daily.     .Marland Kitchen  temazepam (RESTORIL) 30 MG capsule Take 30 mg by mouth at bedtime as needed for sleep.      Results for orders placed or performed during the hospital encounter of 02/09/16 (from the past 48 hour(s))  Glucose, capillary     Status: Abnormal   Collection Time: 02/09/16  6:16 AM  Result Value Ref Range   Glucose-Capillary 133 (H) 65 - 99 mg/dL   No results found.  Review of Systems  Constitutional: Negative.   HENT: Negative.   Respiratory: Negative.   Cardiovascular: Negative.   Gastrointestinal: Negative.   Genitourinary: Negative.   Musculoskeletal: Positive for back pain.  Skin: Negative.   Psychiatric/Behavioral: Negative.     Blood pressure 151/53, pulse 71, temperature 97.8 F (36.6 C), temperature source Oral, resp. rate 20, SpO2 99 %. Physical Exam  Constitutional: She is oriented to person, place, and time. No distress.  HENT:  Head: Atraumatic.  Eyes: EOM are normal.  Neck: Normal range of motion.  Cardiovascular: Normal rate.   Respiratory: No respiratory  distress.  GI: She exhibits no distension.  Musculoskeletal: She exhibits tenderness.  Neurological: She is alert and oriented to person, place, and time.  Skin: Skin is warm and dry.  Psychiatric: She has a normal mood and affect.      EXAM: MRI LUMBAR SPINE WITHOUT AND WITH CONTRAST  TECHNIQUE: Multiplanar and multiecho pulse sequences of the lumbar spine were obtained without and with intravenous contrast.  CONTRAST: 20mL MULTIHANCE GADOBENATE DIMEGLUMINE 529 MG/ML IV SOLN  COMPARISON: Intraoperative lumbar images 07/11/2014. Lumbar MRI 04/13/2014.  FINDINGS: Same numbering system as on the 2015 MRI designating normal lumbar segmentation. Chronic retrolisthesis of L5 on S1 is stable. Mild retrolisthesis at L3-L4 appears stable. Interval postoperative changes to L3-L4, see details below. There is mild right far lateral endplate marrow edema at L3-L4 which appears to be degenerative in nature. No other acute osseous abnormality identified.  Visualized lower thoracic spinal cord is normal with conus medularis at L1-L2. No abnormal intradural enhancement.  Stable and negative visualized abdominal viscera. Mild postoperative changes to the posterior paraspinal soft tissues at L3-L4.  T11-T12: Small chronic central disc extrusion appears stable with narrowing of the ventral CSF space but no spinal stenosis.  T12-L1: Negative.  L1-L2: Mild facet hypertrophy and facet joint fluid is stable. No stenosis.  L2-L3: Minimal disc bulge. Mild facet hypertrophy and bilateral facet joint fluid is stable. No stenosis.  L3-L4: Interval laminectomy. Interval disc space loss. Bulky and lobulated rightward disc extrusion with some associated posterior L3 and L4 endplate Schmorl's node development. Extruded disc with rim enhancement encompassing about 16 mm CC by 15 by 25 mm (AP by transverse) results in severe right lateral recess and moderate to severe right foraminal  stenosis at this level. Decompressed spinal canal with mild to moderate residual spinal stenosis (series 5, image 19). Moderate residual facet hypertrophy with right greater than left facet joint fluid. Underlying circumferential disc bulge. Mild left L3 foraminal stenosis.  L4-L5: Chronic circumferential disc bulge. Mildly progressed superimposed central to right paracentral small disc extrusion (series 5, image 24). Moderate facet and ligament flavum hypertrophy mildly increased. Trace facet joint fluid mildly increased. Epidural lipomatosis. Borderline to mild spinal stenosis.  L5-S1: Chronic disc space loss with left eccentric circumferential disc osteophyte complex. Mild to moderate facet and ligament flavum hypertrophy has increased on the left. Mild epidural lipomatosis. No spinal or lateral recess stenosis. Stable mild bilateral L5 foraminal stenosis.  IMPRESSION: 1. Previous L3-L4 laminectomy with  recurrent bulky and lobulated rightward disc herniation. Severe right lateral recess stenosis. Moderate to severe right L3 foraminal stenosis and mild to moderate spinal stenosis. 2. Mild progression of chronic L4-L5 and L5-S1 degeneration but no other new or increased stenosis since 2015.  Assessment/Plan Right L3-4 HNP Will proceed with microdiscectomy as scheduled.  Surgical procedure along with possible risks and complications discussed.  All questions answered.   Naida Sleight, PA-C 02/09/2016, 7:07 AM

## 2016-02-09 NOTE — Evaluation (Signed)
Physical Therapy Evaluation Patient Details Name: Traci Sullivan MRN: [id number] DOB: 01-05-1946 Today's Date: 02/09/2016   History of Present Illness  70 y.o. female s/p Right 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  Clinical Impression  Patient is s/p above surgery presenting with functional limitations due to the deficits listed below (see PT Problem List). Min assist for transfer from bed, min guard to ambulate safely. Slow and guarded but without overt loss of balance. Educated on precautions and safety with mobility. Patient will benefit from skilled PT to increase their independence and safety with mobility to allow discharge to the venue listed below.       Follow Up Recommendations Other (comment) (Reports family has arranged for [facility] to [facility])    Equipment Recommendations  Rolling walker with 5" wheels    Recommendations for Other Services       Precautions / Restrictions Precautions Precautions: Back Precaution Booklet Issued: Yes (comment) Precaution Comments: reviewed Restrictions Weight Bearing Restrictions: No      Mobility  Bed Mobility               General bed mobility comments: sitting EOB  Transfers Overall transfer level: Needs assistance Equipment used: Rolling walker (2 wheeled) Transfers: Sit to/from Stand Sit to Stand: Min assist         General transfer comment: Light assist to safely rise from bed. VC to maintain precautions and for hand placement.  Ambulation/Gait Ambulation/Gait assistance: Min guard Ambulation Distance (Feet): 50 Feet Assistive device: Rolling walker (2 wheeled) Gait Pattern/deviations: Step-through pattern;Decreased stride length;Trunk flexed Gait velocity: slow Gait velocity interpretation: <1.8 ft/sec, indicative of risk for recurrent falls General Gait Details: Intermittent cues for forward gaze, upright posture, and walker placement for proximity. No buckling or overt loss of  balance noted. Short stride length, cues to increase. Slow and guarded.  Stairs            Wheelchair Mobility    Modified Rankin (Stroke Patients Only)       Balance Overall balance assessment: Needs assistance Sitting-balance support: No upper extremity supported;Feet supported Sitting balance-Leahy Scale: Normal     Standing balance support: Single extremity supported Standing balance-Leahy Scale: Poor                               Pertinent Vitals/Pain Pain Assessment: 0-10 Pain Score: 6  Pain Location: back and RLE Pain Descriptors / Indicators: Radiating Pain Intervention(s): Monitored during session;Repositioned    Home Living Family/patient expects to be discharged to:: Skilled nursing facility Living Arrangements: Spouse/significant other Available Help at Discharge: Family;Available PRN/intermittently Type of Home: House Home Access: Stairs to enter Entrance Stairs-Rails: Right Entrance Stairs-Number of Steps: 2 Home Layout: One level Home Equipment: Walker - 2 wheels;Shower seat;Bedside commode;Cane - single point      Prior Function Level of Independence: Independent with assistive device(s)         Comments: cane in the house and more recently a walker especially when outside.     Hand Dominance   Dominant Hand: Right    Extremity/Trunk Assessment   Upper Extremity Assessment: Defer to OT evaluation           Lower Extremity Assessment: Generalized weakness         Communication   Communication: No difficulties  Cognition Arousal/Alertness: Awake/alert Behavior During Therapy: WFL for tasks assessed/performed Overall Cognitive Status: Within Functional Limits for tasks assessed  General Comments General comments (skin integrity, edema, etc.): Reviewed precautions. Family present and supportive    Exercises General Exercises - Lower Extremity Ankle Circles/Pumps: AROM;Both;10  reps;Seated      Assessment/Plan    PT Assessment Patient needs continued PT services  PT Diagnosis Abnormality of gait;Difficulty walking;Generalized weakness;Acute pain   PT Problem List Decreased strength;Decreased activity tolerance;Decreased balance;Decreased mobility;Decreased knowledge of use of DME;Decreased knowledge of precautions;Obesity;Pain  PT Treatment Interventions DME instruction;Gait training;Functional mobility training;Therapeutic activities;Therapeutic exercise;Balance training;Patient/family education   PT Goals (Current goals can be found in the Care Plan section) Acute Rehab PT Goals Patient Stated Goal: Go to [facility] PT Goal Formulation: With patient Time For Goal Achievement: 02/23/16 Potential to Achieve Goals: Good    Frequency Min 5X/week   Barriers to discharge Decreased caregiver support Husband passed away. Now lives alone but has some assist from family in area.    Co-evaluation               End of Session Equipment Utilized During Treatment: Gait belt Activity Tolerance: Patient tolerated treatment well Patient left: in chair;with call bell/phone within reach;with family/visitor present           Time: 1610-9604 PT Time Calculation (min) (ACUTE ONLY): 20 min   Charges:   PT Evaluation $PT Eval Low Complexity: 1 Procedure     PT G CodesBerton Mount 02/09/2016, 5:05 PM  Sunday [place], [place] [phone]

## 2016-02-09 NOTE — Progress Notes (Signed)
Pharmacy Antibiotic Note  Traci Sullivan is a 70 y.o. female admitted on 02/09/2016 with surgical prophylaxis.  Pharmacy has been consulted for vancomycin dosing. Pt has no drain and will get IV vanc dose x 1.  Plan: Vancomycin 1500 mg IV x 1.      Temp (24hrs), Avg:97.7 F (36.5 C), Min:97.6 F (36.4 C), Max:97.8 F (36.6 C)   Recent Labs Lab 02/05/16 0958  WBC 6.3  CREATININE 0.77    Estimated Creatinine Clearance: 90.6 mL/min (by C-G formula based on Cr of 0.77).    Allergies  Allergen Reactions  . Penicillins Other (See Comments)    REACTION: Splotches  . Codeine Nausea Only  . Doxycycline Nausea Only     Thank you for allowing us to participate in this patients care. Traci Sullivan, PharmD Pager: [phone] 02/09/2016 2:40 PM

## 2016-02-09 NOTE — Brief Op Note (Signed)
02/09/2016  10:34 AM  PATIENT:  Traci Sullivan  70 y.o. 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 Vamsi Apfel pa-c    ANESTHESIA:   general  EBL:  Total I/O In: 1700 [I.V.:1700] Out: 300 [Blood:300]  BLOOD ADMINISTERED:none  DRAINS: none   LOCAL MEDICATIONS USED:  MARCAINE     SPECIMEN:  No Specimen  DISPOSITION OF SPECIMEN:  N/A  COUNTS:  YES  TOURNIQUET:  * No tourniquets in log *  DICTATION: .Dragon Dictation  PLAN OF CARE: Admit to inpatient   PATIENT DISPOSITION:  PACU - hemodynamically stable.

## 2016-02-09 NOTE — Anesthesia Procedure Notes (Signed)
Procedure Name: Intubation Date/Time: 02/09/2016 7:43 AM Performed by: Lanell MatarBAKER, Shauntea Lok M Pre-anesthesia Checklist: Patient identified, Emergency Drugs available, Suction available and Patient being monitored Patient Re-evaluated:Patient Re-evaluated prior to inductionOxygen Delivery Method: Circle System Utilized Preoxygenation: Pre-oxygenation with 100% oxygen Intubation Type: IV induction Ventilation: Mask ventilation without difficulty Laryngoscope Size: Miller and 2 Grade View: Grade I Tube type: Oral Tube size: 7.5 mm Number of attempts: 1 Airway Equipment and Method: Stylet Placement Confirmation: ETT inserted through vocal cords under direct vision,  positive ETCO2 and breath sounds checked- equal and bilateral Secured at: 21 cm Tube secured with: Tape Dental Injury: Teeth and Oropharynx as per pre-operative assessment

## 2016-02-10 LAB — GLUCOSE, CAPILLARY: Glucose-Capillary: 177 mg/dL — ABNORMAL HIGH (ref 65–99)

## 2016-02-10 NOTE — Progress Notes (Signed)
OT Cancellation Note  Patient Details Name: Traci Sullivan MRN: [id number] DOB: 21-May-1946   Cancelled Treatment:    Reason Eval/Treat Not Completed:  (screen) Pt's current D/C plan is SNF. No apparent immediate acute care OT needs, therefore will defer OT to SNF. If OT eval is needed please call Acute Rehab Dept. at [phone] or text page OT at [phone].    Earlie RavelingStraub, Cele Mote L OTR/L [phone] 02/10/2016, 8:12 AM

## 2016-02-10 NOTE — Op Note (Signed)
NAMValetta Close:  Sullivan, Traci                ACCOUNT NO.:  [id number]  MEDICAL RECORD NO.:  [id number]  LOCATION:  [id number]                        FACILITY:  [facility]  PHYSICIAN:  Mark C. Ophelia CharterYates, M.D.    DATE OF BIRTH:  Dec 26, 1945  DATE OF PROCEDURE:  02/09/2016 DATE OF DISCHARGE:                              OPERATIVE REPORT   PREOPERATIVE DIAGNOSIS:  Recurrent right L3-4 herniated nucleus pulposus.  POSTOPERATIVE DIAGNOSIS:  Recurrent right L3-4 herniated nucleus pulposus.  PROCEDURE:  Repeat exploration and removal of right L3-4 large HNP.  SURGEON:  Mark C. Ophelia CharterYates, M.D.  ASSISTANT:  Genene ChurnJames M. Barry Dieneswens, PA-C, medically necessary and present for the entire procedure.  ESTIMATED BLOOD LOSS:  Less than 200 mL.  DRAINS:  None.  INDICATIONS:  This 70 year old female had previous surgery 2 years ago, did well until a few months ago and had recurrent right leg pain and weakness, falling history, leg giving way, and MRI scan showed large HNP L3-4 with endplate degenerative edematous changes and a large extruded fragment causing paracentral compression and severe foraminal stenosis.  DESCRIPTION OF PROCEDURE:  After induction of general anesthesia, the patient had a 3 cm old scar present.  She was placed on prone position. Chest rolls after intubation.  Ancef prophylaxis, time-out procedure, DuraPrep, usual sterile skin marker, squared area by towels, and then Betadine Steri-Drape with laminectomy sheet.  Incision was made using the old incision, extending it proximally and distally.  Subperiosteally dissection down onto the lamina above and below.  The patient had fairly extensive laminotomy due to the narrow canal and large disc fragment. Starting above, a little bit of the lamina was removed with a bur.  The patient had the narrow interpedicular distance, and following the lateral gutter down to the nerve root above the 3-4 disc space, continued dissection, repeat x-ray was taken.  Initial  x-rays with spinal needle at the 3-4 level and the 2nd with a Penfield 4 over it. Continued dissection, removal lateral bone, and then down at the floor of the canal.  Disk space was not visualized, nerve root was seen and then going in the axilla.  We were just barely above the disk space and then the 3rd x-ray was taken with Penfield 4 at the base of the canal just above the disk space.  A little bit more bone was removed, the portion of the facet was removed since it was mobile.  Overhanging spurs were trimmed out, there were thick chunks of ligament intermixed with disk fragments at the level of the disc and these using operative microscope were teased loose and microdissection was used where it was adherent to the dura.  Multiple chunks of disk were removed till a hockey stick could be passed anterior to the dura.  Continue passes into the disk space removing fragments disc.  Anterior annulus was intact. Micro and straight regular and up-biting pituitaries were used.  Once there was good decompression, lateral portion of the dura had no pressure applied to it and no compression from the ventral surface.  After irrigation with saline solution, some Surgiflo was injected, waited several minutes, suctioned out and then standard layered closure with #1 Vicryl, 2-0  Vicryl subcutaneous tissue, subcuticular closure, Dermabond on the skin, and postop dressing.  Instrument count and needle count were correct.     Mark C. Ophelia Charter, M.D.     MCY/MEDQ  D:  02/09/2016  T:  02/10/2016  Job:  [id number]

## 2016-02-10 NOTE — Discharge Summary (Signed)
Physician Discharge Summary  Patient ID: NYALA KIRCHNER MRN: [id number] DOB/AGE: 11-May-1946 70 y.o.  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 She underwent lumbar decompression. Postoperatively she progressed slowly and was discharged to skilled nursing.  Consults: None  Significant Diagnostic Studies: labs: Routine labs  Treatments: surgery: See operative note  Discharge Exam: Blood pressure 118/60, pulse 108, temperature 98.2 F (36.8 C), temperature source Oral, resp. rate 16, SpO2 95 %. Incision/Wound: Dressing clean and dry  Disposition: 01-Home or Self Care  Discharge Instructions    Call MD / Call 911    Complete by:  As directed   If you experience chest pain or shortness of breath, CALL 911 and be transported to the hospital emergency room.  If you develope a fever above 101 F, pus (white drainage) or increased drainage or redness at the wound, or calf pain, call your surgeon's office.     Call MD / Call 911    Complete by:  As directed   If you experience chest pain or shortness of breath, CALL 911 and be transported to the hospital emergency room.  If you develope a fever above 101 F, pus (white drainage) or increased drainage or redness at the wound, or calf pain, call your surgeon's office.     Constipation Prevention    Complete by:  As directed   Drink plenty of fluids.  Prune juice may be helpful.  You may use a stool softener, such as Colace (over the counter) 100 mg twice a day.  Use MiraLax (over the counter) for constipation as needed.     Constipation Prevention    Complete by:  As directed   Drink plenty of fluids.  Prune juice may be helpful.  You may use a stool  softener, such as Colace (over the counter) 100 mg twice a day.  Use MiraLax (over the counter) for constipation as needed.     Diet - low sodium heart healthy    Complete by:  As directed      Diet - low sodium heart healthy    Complete by:  As directed      Discharge instructions    Complete by:  As directed   Ok to shower 5 days postop.  Do not apply any creams or ointments to incision.  Do not remove steri-strips.  Can use 4x4 gauze and tape for dressing changes.  No aggressive activity.  No bending, squatting or prolonged sitting.  Mostly be in reclined position or lying down.     Driving restrictions    Complete by:  As directed   No driving     Increase activity slowly as tolerated    Complete by:  As directed      Increase activity slowly as tolerated    Complete by:  As directed      Lifting restrictions    Complete by:  As directed   No lifting            Medication List    STOP taking these medications        HYDROcodone-acetaminophen 5-325 MG tablet  Commonly known as:  NORCO/VICODIN     meloxicam 15 MG tablet  Commonly known as:  MOBIC      TAKE these  medications        aspirin 81 MG tablet  Take 81 mg by mouth daily.     atorvastatin 20 MG tablet  Commonly known as:  LIPITOR  Take 20 mg by mouth daily at 6 PM.     carbamazepine 200 MG tablet  Commonly known as:  TEGRETOL  Take 1-2 tablets by mouth 2 (two) times daily. 1 tab daily, 2 tabs at bedtime     cholecalciferol 1000 units tablet  Commonly known as:  VITAMIN D  Take 1,000 Units by mouth daily.     glipiZIDE 2.5 MG 24 hr tablet  Commonly known as:  GLUCOTROL XL  Take 1 tablet by mouth daily.     losartan 25 MG tablet  Commonly known as:  COZAAR  Take 25 mg by mouth daily.     oxyCODONE-acetaminophen 5-325 MG tablet  Commonly known as:  PERCOCET/ROXICET  Take 1-2 tablets by mouth every 6 (six) hours as needed for moderate pain.     pioglitazone 30 MG tablet  Commonly known as:  ACTOS   Take 30 mg by mouth daily.     temazepam 30 MG capsule  Commonly known as:  RESTORIL  Take 30 mg by mouth at bedtime as needed for sleep.           Follow-up Information    Schedule an appointment as soon as possible for a visit with Eldred MangesYATES,MARK C, MD.   Specialty:  Orthopedic Surgery   Why:  need return office visit one week postop   Contact information:   [address] [place] [address] [phone]       Signed: Nadara MustardDUDA,Brittanie Dosanjh V 02/10/2016, 8:47 AM

## 2016-02-10 NOTE — Clinical Social Work Placement (Signed)
   CLINICAL SOCIAL WORK PLACEMENT  NOTE  Date:  02/10/2016  Patient Details  Name: Wolfgang Phoenixnnie C Tash MRN: [id number] Date of Birth: Dec 16, 1945  Clinical Social Work is seeking post-discharge placement for this patient at the Skilled  Nursing Facility level of care (*CSW will initial, date and re-position this form in  chart as items are completed):  Yes   Patient/family provided with [place] [facility] Clinical Social Work Department's list of facilities offering this level of care within the geographic area requested by the patient (or if unable, by the patient's family).  Yes   Patient/family informed of their freedom to choose among providers that offer the needed level of care, that participate in Medicare, Medicaid or managed care program needed by the patient, have an available bed and are willing to accept the patient.  Yes   Patient/family informed of [facility]'s ownership interest in [facility] and [facility], as well as of the fact that they are under no obligation to receive care at these facilities.  PASRR submitted to EDS on 02/10/16     PASRR number received on 02/10/16     Existing PASRR number confirmed on       FL2 transmitted to all facilities in geographic area requested by pt/family on 02/10/16     FL2 transmitted to all facilities within larger geographic area on       Patient informed that his/her managed care company has contracts with or will negotiate with certain facilities, including the following:        Yes   Patient/family informed of bed offers received.  [facility]     Physician recommends and patient chooses bed at      [facility] on 02/10/16[facility]  [facility]     Patient family notified on 02/10/16 of transfer.  Name of family member notified:  Family at bedside and providing transport     PHYSICIAN       Additional Comment:    Macario GoldsJesse Isabell Bonafede, LCSW [phone]

## 2016-02-10 NOTE — NC FL2 (Signed)
Traci Sullivan MEDICAID FL2 LEVEL OF CARE SCREENING TOOL     IDENTIFICATION  Patient Name: Traci Sullivan Birthdate: [birth year] Sex: female Admission Date (Current Location): 02/09/2016  [facility] [place]   Facility and Address:  [facility], [address], [place], [place] [address]      Provider Number: [id number]  Attending Physician Name and Address:  Eldred MangesMark C Yates, MD  Relative Name and Phone Number:       Current Level of Care: Hospital Recommended Level of Care: Skilled Nursing Facility Prior Approval Number:    Date Approved/Denied:   PASRR Number: [id number]  Discharge Plan: SNF    Current Diagnoses: Patient Active Problem List   Diagnosis Date Noted  . History of lumbar laminectomy for spinal cord decompression 02/09/2016  . HNP (herniated nucleus pulposus), lumbar 07/11/2014  . DIABETES MELLITUS, CONTROLLED 01/18/2011  . VAGINITIS 01/18/2011  . MENOPAUSAL SYNDROME 01/18/2011  . FATIGUE 01/18/2011  . HYPERTENSION 09/30/2009  . SEIZURE DISORDER 09/30/2009    Orientation RESPIRATION BLADDER Height & Weight     Self, Time, Situation, Place  Normal Continent Weight:   Height:     BEHAVIORAL SYMPTOMS/MOOD NEUROLOGICAL BOWEL NUTRITION STATUS    Convulsions/Seizures (Seizure Disorder) Continent Diet (Carb Modified / Thin Liquids)  AMBULATORY STATUS COMMUNICATION OF NEEDS Skin   Limited Assist Verbally Surgical wounds (Back Incision)                       Personal Care Assistance Level of Assistance  Bathing, Feeding, Dressing Bathing Assistance: Limited assistance Feeding assistance: Independent Dressing Assistance: Limited assistance     Functional Limitations Info  Sight, Hearing, Speech Sight Info: Adequate Hearing Info: Adequate Speech Info: Adequate    SPECIAL CARE FACTORS FREQUENCY  OT (By licensed OT), PT (By licensed PT)     PT Frequency: 3 OT Frequency: 3            Contractures  Contractures Info: Not present    Additional Factors Info  Code Status, Allergies Code Status Info: Full Allergies Info: Penicillins, Codeine, Doxycycline           Current Medications (02/10/2016):  This is the current hospital active medication list Current Facility-Administered Medications  Medication Dose Route Frequency Provider Last Rate Last Dose  . 0.45 % sodium chloride infusion   Intravenous Continuous Naida SleightJames M Owens, PA-C      . 0.9 %  sodium chloride infusion  250 mL Intravenous Continuous Naida SleightJames M Owens, PA-C 1 mL/hr at 02/09/16 1735 250 mL at 02/09/16 1735  . acetaminophen (TYLENOL) tablet 650 mg  650 mg Oral Q4H PRN Naida SleightJames M Owens, PA-C   650 mg at 02/10/16 1021   Or  . acetaminophen (TYLENOL) suppository 650 mg  650 mg Rectal Q4H PRN Naida SleightJames M Owens, PA-C      . atorvastatin (LIPITOR) tablet 20 mg  20 mg Oral q1800 Naida SleightJames M Owens, PA-C   20 mg at 02/09/16 1737  . carbamazepine (TEGRETOL) tablet 200 mg  200 mg Oral Daily Naida SleightJames M Owens, PA-C   200 mg at 02/10/16 1009  . carbamazepine (TEGRETOL) tablet 400 mg  400 mg Oral QHS Eldred MangesMark C Yates, MD   400 mg at 02/09/16 2209  . docusate sodium (COLACE) capsule 100 mg  100 mg Oral BID Naida SleightJames M Owens, PA-C   100 mg at 02/10/16 1009  . glipiZIDE (GLUCOTROL XL) 24 hr tablet 2.5 mg  2.5 mg Oral  Daily Naida Sleight, PA-C   2.5 mg at 02/10/16 1009  . HYDROmorphone (DILAUDID) injection 0.5-1 mg  0.5-1 mg Intravenous Q3H PRN Naida Sleight, PA-C   1 mg at 02/09/16 1759  . losartan (COZAAR) tablet 25 mg  25 mg Oral Daily Naida Sleight, PA-C   25 mg at 02/10/16 1009  . menthol-cetylpyridinium (CEPACOL) lozenge 3 mg  1 lozenge Oral PRN Naida Sleight, PA-C       Or  . phenol [facility]) mouth spray 1 spray  1 spray Mouth/Throat PRN Naida Sleight, PA-C      . ondansetron [facility]) injection 4 mg  4 mg Intravenous Q4H PRN Naida Sleight, PA-C   4 mg at 02/09/16 2007  . oxyCODONE-acetaminophen (PERCOCET/ROXICET) 5-325 MG per tablet 1-2 tablet  1-2 tablet  Oral Q6H PRN Naida Sleight, PA-C   2 tablet at 02/10/16 0507  . pioglitazone (ACTOS) tablet 30 mg  30 mg Oral Daily Naida Sleight, PA-C   30 mg at 02/10/16 1009  . polyethylene glycol (MIRALAX / GLYCOLAX) packet 17 g  17 g Oral Daily PRN Naida Sleight, PA-C      . promethazine (PHENERGAN) injection 12.5 mg  12.5 mg Intravenous Q4H PRN Eldred Manges, MD   12.5 mg at 02/09/16 2210  . sodium chloride flush (NS) 0.9 % injection 3 mL  3 mL Intravenous Q12H Naida Sleight, PA-C   3 mL at 02/09/16 2200  . sodium chloride flush (NS) 0.9 % injection 3 mL  3 mL Intravenous PRN Naida Sleight, PA-C      . temazepam (RESTORIL) capsule 30 mg  30 mg Oral QHS PRN Naida Sleight, PA-C   15 mg at 02/09/16 1238     Discharge Medications: Please see discharge summary for a list of discharge medications.  Relevant Imaging Results:  Relevant Lab Results:   Additional Information SSN [id number]  Macario Golds, [place] [phone]

## 2016-02-10 NOTE — Clinical Social Work Note (Signed)
Clinical Social Worker facilitated patient discharge including contacting patient family and facility to confirm patient discharge plans.  Clinical information faxed to facility and family agreeable with plan.  CSW arranged transport via family to [facility].  RN to call report prior to discharge.  Clinical Social Worker will sign off for now as social work intervention is no longer needed. Please consult us again if new need arises.  Macario GoldsJesse Elona Yinger, [place] [phone]

## 2016-02-10 NOTE — Progress Notes (Signed)
Report called to [facility] spoke with Chris 

## 2016-02-10 NOTE — Progress Notes (Signed)
Physical Therapy Treatment Patient Details Name: Traci Sullivan MRN: [id number] DOB: 11/03/1946 Today's Date: 02/10/2016    History of Present Illness 70 y.o. female s/p Right 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    PT Comments    Patient c/o pain in back and radiating pain in R LE. Pt declined ambulating outside of room. Continue to progress as tolerated with anticipated d/c to SNF for further skilled PT services.    Follow Up Recommendations  Other (comment) (Reports family has arranged for pt to stay [facility])     Equipment Recommendations  Rolling walker with 5" wheels;Other (comment) (wide RW)    Recommendations for Other Services       Precautions / Restrictions Precautions Precautions: Back Precaution Booklet Issued: Yes (comment) Precaution Comments: reviewed Restrictions Weight Bearing Restrictions: No    Mobility  Bed Mobility               General bed mobility comments: sitting on BSC getting dressed with assist from family member  Transfers Overall transfer level: Needs assistance Equipment used: Rolling walker (2 wheeled) Transfers: Sit to/from Stand Sit to Stand: Min assist         General transfer comment: min A for safety; cues for hand placement with pt still pulling on RW to stand  Ambulation/Gait Ambulation/Gait assistance: Min guard Ambulation Distance (Feet): 15 Feet Assistive device: Rolling walker (2 wheeled) Gait Pattern/deviations: Step-through pattern;Decreased stride length;Trunk flexed Gait velocity: slow   General Gait Details: cues for posture and position of RW; pt declined further mobility this session and reported her legs feel weak   Stairs            Wheelchair Mobility    Modified Rankin (Stroke Patients Only)       Balance     Sitting balance-Leahy Scale: Normal       Standing balance-Leahy Scale: Poor                      Cognition Arousal/Alertness:  Awake/alert Behavior During Therapy: WFL for tasks assessed/performed Overall Cognitive Status: Within Functional Limits for tasks assessed                      Exercises      General Comments General comments (skin integrity, edema, etc.): reviewed precuations and educated pt on importance of being OOB and mobility      Pertinent Vitals/Pain Pain Assessment: 0-10 Pain Score: 7  Pain Location: back and R LE Pain Descriptors / Indicators: Radiating;Sore Pain Intervention(s): Limited activity within patient's tolerance;Monitored during session;Repositioned    Home Living                      Prior Function            PT Goals (current goals can now be found in the care plan section) Acute Rehab PT Goals Patient Stated Goal: Go to [facility] PT Goal Formulation: With patient Time For Goal Achievement: 02/23/16 Potential to Achieve Goals: Good Progress towards PT goals: Progressing toward goals    Frequency  Min 5X/week    PT Plan Current plan remains appropriate    Co-evaluation             End of Session Equipment Utilized During Treatment: Gait belt Activity Tolerance: Patient tolerated treatment well Patient left: in chair;with call bell/phone within reach;with family/visitor present     Time: 1050-1104 PT Time Calculation (min) (ACUTE ONLY):  14 min  Charges:  $Gait Training: 8-22 mins                    G Codes:      Traci Sullivan, PTA Pager: [phone]   02/10/2016, 11:11 AM

## 2016-02-12 ENCOUNTER — Encounter (HOSPITAL_COMMUNITY): Payer: Self-pay | Admitting: Orthopaedic Surgery

## 2016-05-10 ENCOUNTER — Emergency Department
Admission: EM | Admit: 2016-05-10 | Discharge: 2016-05-10 | Disposition: A | Discharge status: Home / Self Care | Payer: Medicare Other | Source: Home / Self Care | Attending: Family Medicine | Admitting: Family Medicine

## 2016-05-10 ENCOUNTER — Encounter: Payer: Self-pay | Admitting: Emergency Medicine

## 2016-05-10 DIAGNOSIS — R358 Other polyuria: Secondary | ICD-10-CM | POA: Diagnosis not present

## 2016-05-10 DIAGNOSIS — L298 Other pruritus: Secondary | ICD-10-CM | POA: Diagnosis not present

## 2016-05-10 DIAGNOSIS — N390 Urinary tract infection, site not specified: Secondary | ICD-10-CM

## 2016-05-10 DIAGNOSIS — N898 Other specified noninflammatory disorders of vagina: Secondary | ICD-10-CM

## 2016-05-10 DIAGNOSIS — R3589 Other polyuria: Secondary | ICD-10-CM

## 2016-05-10 LAB — POCT URINALYSIS DIP (MANUAL ENTRY)
Bilirubin, UA: NEGATIVE
Glucose, UA: NEGATIVE
Ketones, POC UA: NEGATIVE
Nitrite, UA: NEGATIVE
Protein Ur, POC: NEGATIVE
Spec Grav, UA: 1.015 (ref 1.005–1.03)
Urobilinogen, UA: 0.2 (ref 0–1)
pH, UA: 7.5 (ref 5–8)

## 2016-05-10 MED ORDER — NITROFURANTOIN MONOHYD MACRO 100 MG PO CAPS: 100.0000 mg | ORAL_CAPSULE | Freq: Two times a day (BID) | ORAL | Status: DC

## 2016-05-10 MED ORDER — FLUCONAZOLE 150 MG PO TABS: 150.0000 mg | ORAL_TABLET | Freq: Once | ORAL | Status: DC

## 2016-05-10 NOTE — ED Notes (Signed)
Vaginal itching, polyuria, dysuria x 10 days

## 2016-05-10 NOTE — ED Provider Notes (Signed)
CSN: [id number]     Arrival date & time 05/10/16  1110 History   First MD Initiated Contact with Patient 05/10/16 1112     Chief Complaint  Patient presents with  . Polyuria    Polyuria, dysuria x 10 days  . Vaginal Itching    Vaginal itching x 10 days   (Consider location/radiation/quality/duration/timing/severity/associated sxs/prior Treatment) HPI Traci Sullivan is a 70 y.o. female presenting to UC with c/o 10 days of gradually worsening vaginal itching and irritation, polyuria, and dysuria. Pt was seen at [facility] about 3 months ago with similar urinary symptoms. She was treated with Macrobid, symptoms resolved and no side effects from the medication.  She notes she fell recently due to having chronic back problems, and has been needing to use her walker more, she questions if that is what caused her current symptoms.  Denies fever or chills, has had mild nausea. Hx of yeast infections and notes vaginal symptoms feel similar.    Past Medical History  Diagnosis Date  . Hyperlipidemia   . Hypertension   . Asthma   . Type II diabetes mellitus (HCC)   . GERD (gastroesophageal reflux disease)     intermittent  . Arthritis   . History of kidney stones   . Complication of anesthesia     reports that sometimes she is hard to wake up other time she is not.  2015 Panic attack and continued to have them for 6 weeks post  op.  . Shortness of breath dyspnea     with activity- "tying shoes'  X"Due to weight gain"  . Panic attacks 2015    awaking from anesthesia and for 6 weeks after.  Ocassionaly feels painc  . Depression     "husband died 2015-06-08"  . Trigeminal neuralgia    Past Surgical History  Procedure Laterality Date  . Appendectomy    . Cesarean section    . Cholecystectomy    . Tubal ligation    . Total abdominal hysterectomy    . Cystoscopy w/ stone manipulation    . Lumbar laminectomy/decompression microdiscectomy N/A 07/11/2014    Procedure: MICRODISCECTOMY LUMBAR LAMINECTOMY  -L3-L4 BILATERAL MICRODISCECTOMY ;  Surgeon: Eldred Manges, MD;  Location: [facility] OR;  Service: Orthopedics;  Laterality: N/A;  . Back surgery    . Lumbar laminectomy/decompression microdiscectomy N/A 02/09/2016    Procedure: Right 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;  Surgeon: Eldred Manges, MD;  Location: [facility] OR;  Service: Orthopedics;  Laterality: N/A;   Family History  Problem Relation Age of Onset  . Hypertension Mother   . Heart attack Mother   . Stroke Father   . Kidney disease Father   . Cancer Sister     breast- in 37's  . Diabetes Other     family hx of  . Hypertension Other     family hx of  . Cancer Other     prostate/ family hx of  . Cancer Sister 66    colon  . Stroke Other   . Heart attack Other    Social History  Substance Use Topics  . Smoking status: Former Smoker -- 5 years    Types: Cigarettes  . Smokeless tobacco: Never Used     Comment: smoked as a teenager  . Alcohol Use: No   OB History    No data available     Review of Systems  Constitutional: Negative for fever and chills.  Gastrointestinal: Positive for nausea.  Negative for vomiting, abdominal pain and diarrhea.  Genitourinary: Positive for dysuria, urgency, frequency and vaginal pain ( itching/irritation). Negative for hematuria, flank pain and decreased urine volume.  Musculoskeletal: Positive for back pain ( chronic). Negative for myalgias.    Allergies  Penicillins; Codeine; and Doxycycline  Home Medications   Prior to Admission medications   Medication Sig Start Date End Date Taking? Authorizing Provider  meloxicam (MOBIC) 15 MG tablet Take 15 mg by mouth daily.   Yes Historical Provider, MD  aspirin 81 MG tablet Take 81 mg by mouth daily.      Historical Provider, MD  atorvastatin (LIPITOR) 20 MG tablet Take 20 mg by mouth daily at 6 PM.  07/22/13   Historical Provider, MD  carbamazepine (TEGRETOL) 200 MG tablet Take 1-2 tablets by mouth 2 (two) times daily. 1 tab  daily, 2 tabs at bedtime 12/19/15   Historical Provider, MD  cholecalciferol (VITAMIN D) 1000 UNITS tablet Take 1,000 Units by mouth daily.      Historical Provider, MD  fluconazole (DIFLUCAN) 150 MG tablet Take 1 tablet (150 mg total) by mouth once. May repeat in 3 days if still having symptoms 05/10/16   Junius FinnerErin O'Malley, PA-C  glipiZIDE (GLUCOTROL XL) 2.5 MG 24 hr tablet Take 1 tablet by mouth daily. 11/09/15   Historical Provider, MD  losartan (COZAAR) 25 MG tablet Take 25 mg by mouth daily.      Historical Provider, MD  nitrofurantoin, macrocrystal-monohydrate, (MACROBID) 100 MG capsule Take 1 capsule (100 mg total) by mouth 2 (two) times daily. 05/10/16   Junius FinnerErin O'Malley, PA-C  oxyCODONE-acetaminophen (PERCOCET/ROXICET) 5-325 MG tablet Take 1-2 tablets by mouth every 6 (six) hours as needed for moderate pain. 02/09/16   Naida SleightJames M Owens, PA-C  pioglitazone (ACTOS) 30 MG tablet Take 30 mg by mouth daily.  09/03/13   Historical Provider, MD  temazepam (RESTORIL) 30 MG capsule Take 30 mg by mouth at bedtime as needed for sleep.    Historical Provider, MD   Meds Ordered and Administered this Visit  Medications - No data to display  BP 142/82 mmHg  Pulse 85  Temp(Src) 97.5 F (36.4 C) (Oral)  Ht 5\' 4"  (1.626 m)  Wt 298 lb (135.172 kg)  BMI 51.13 kg/m2  SpO2 94% No data found.   Physical Exam  Constitutional: She is oriented to person, place, and time. She appears well-developed and well-nourished.  HENT:  Head: Normocephalic and atraumatic.  Mouth/Throat: Oropharynx is clear and moist.  Eyes: EOM are normal.  Neck: Normal range of motion.  Cardiovascular: Normal rate, regular rhythm and normal heart sounds.   Pulmonary/Chest: Effort normal and breath sounds normal. No respiratory distress. She has no wheezes. She has no rales.  Abdominal: Soft. She exhibits no distension. There is no tenderness. There is no CVA tenderness.  Musculoskeletal: Normal range of motion.  Neurological: She is alert  and oriented to person, place, and time.  Skin: Skin is warm and dry.  Psychiatric: She has a normal mood and affect. Her behavior is normal.  Nursing note and vitals reviewed.   ED Course  Procedures (including critical care time)  Labs Review Labs Reviewed  POCT URINALYSIS DIP (MANUAL ENTRY) - Abnormal; Notable for the following:    Clarity, UA cloudy (*)    Blood, UA trace-intact (*)    Leukocytes, UA Trace (*)    All other components within normal limits  WET PREP BY MOLECULAR PROBE  URINE CULTURE    Imaging Review No  results found.    MDM   1. Polyuria   2. Vaginal itching   3. UTI (lower urinary tract infection)    UA c/w UTI, will send culture.  Pt did a self-swab for Wet prep to test for yeast infection.  Wet prep sent to lab.  Pt allergic to doxycycline and PCNs.   Will place pt back on macrobid as she did well on the medication for last UTI.  Rx: Macrobid and diflucan 150mg  once.  Encouraged f/u with PCP if not improving in 3-4 days, sooner if worsening. Patient verbalized understanding and agreement with treatment plan.     Junius Finnerrin O'Malley, PA-C 05/10/16 1324

## 2016-05-13 ENCOUNTER — Telehealth: Payer: Self-pay

## 2016-05-13 LAB — URINE CULTURE: Colony Count: >=100000

## 2016-05-13 NOTE — ED Notes (Signed)
Notified patient of results of lab.  Wet Prep is still pending.  Says that she is feeling better, but still having frequency.  Itching has resolved.  Pt notified to call if questions or problems.

## 2016-05-15 ENCOUNTER — Telehealth: Payer: Self-pay | Admitting: Emergency Medicine

## 2016-05-15 NOTE — ED Notes (Signed)
Advised patient that she was treated appropriately for the UTI and given diflucan for yeast, to follow up with PCP or Urologist for polyuria. Her blood sugar has been fine and she has appt with PCP next week.

## 2016-05-18 LAB — SURESWAB BACTERIAL VAGINOSIS/ITIS
Atopobium vaginae: NOT DETECTED Log (cells/mL)
C. albicans, DNA: NOT DETECTED
C. glabrata, DNA: NOT DETECTED
C. parapsilosis, DNA: NOT DETECTED
C. tropicalis, DNA: NOT DETECTED
Gardnerella vaginalis: NOT DETECTED Log (cells/mL)
LACTOBACILLUS SPECIES: NOT DETECTED Log (cells/mL)
MEGASPHAERA SPECIES: 5.7 Log (cells/mL)
T. vaginalis RNA, QL TMA: NOT DETECTED

## 2016-07-09 ENCOUNTER — Other Ambulatory Visit (HOSPITAL_COMMUNITY): Payer: Self-pay | Admitting: Surgery

## 2016-07-09 ENCOUNTER — Ambulatory Visit (HOSPITAL_COMMUNITY)
Admission: RE | Admit: 2016-07-09 | Discharge: 2016-07-09 | Disposition: A | Discharge status: Home / Self Care | Payer: Medicare Other | Source: Ambulatory Visit | Attending: Orthopaedic Surgery | Admitting: Orthopaedic Surgery

## 2016-07-09 DIAGNOSIS — M7989 Other specified soft tissue disorders: Secondary | ICD-10-CM | POA: Diagnosis not present

## 2016-07-09 DIAGNOSIS — M79601 Pain in right arm: Secondary | ICD-10-CM

## 2016-07-09 DIAGNOSIS — M79604 Pain in right leg: Secondary | ICD-10-CM | POA: Diagnosis not present

## 2016-07-09 NOTE — Progress Notes (Signed)
**  Preliminary report by tech**  Right lower extremity venous duplex completed. There is no evidence of deep or superficial vein thrombosis involving the right lower extremity. All visualized vessels appear patent and compressible. There is no evidence of a Baker's cyst on the right.  07/09/16 1:25 PM Olen CordialGreg Rayan Ines RVT

## 2016-10-16 DIAGNOSIS — Z9181 History of falling: Secondary | ICD-10-CM | POA: Insufficient documentation

## 2017-07-15 ENCOUNTER — Ambulatory Visit (INDEPENDENT_AMBULATORY_CARE_PROVIDER_SITE_OTHER): Payer: Medicare Other | Admitting: Orthopaedic Surgery

## 2017-07-15 ENCOUNTER — Encounter (INDEPENDENT_AMBULATORY_CARE_PROVIDER_SITE_OTHER): Payer: Self-pay | Admitting: Orthopaedic Surgery

## 2017-07-15 ENCOUNTER — Ambulatory Visit (INDEPENDENT_AMBULATORY_CARE_PROVIDER_SITE_OTHER): Payer: Medicare Other

## 2017-07-15 VITALS — Ht 64.0 in | Wt 300.0 lb

## 2017-07-15 DIAGNOSIS — M545 Low back pain: Secondary | ICD-10-CM

## 2017-07-15 NOTE — Progress Notes (Signed)
Office Visit Note   Patient: Traci Sullivan           Date of Birth: [birth year]           MRN: [id number] Visit Date: 07/15/2017              Requested by: Juluis RainierBarnes, Elizabeth, MD [address] [place], [place] [address] PCP: Juluis RainierBarnes, Elizabeth, MD   Assessment & Plan: Visit Diagnoses:  1. Low back pain, unspecified back pain laterality, unspecified chronicity, with sciatica presence unspecified     Plan: Patient's symptoms of been intermittent recently with more pain in particular right foot swelling. Doppler tests were negative for DVT. We discussed using support stockings elevating her feet for the swelling. She is to work on weight loss with diet and try to gradually increase her ambulation to improve her leg strength and balance. We discussed again as we did in September 2017 [facility] to help with her morbid obesity and BMI greater than 50. I plan to check her back again in 6 months. If she gets increased symptoms she can return prior to this. Follow-Up Instructions: No Follow-up on file.   Orders:  Orders Placed This Encounter  Procedures  . XR Lumbar Spine 2-3 Views   No orders of the defined types were placed in this encounter.     Procedures: No procedures performed   Clinical Data: No additional findings.   Subjective: Chief Complaint  Patient presents with  . Lower Back - Pain  . Right Foot - Pain    HPI 71 year old female seen with complaints of right foot pain and some right foot swelling 6 months. Patient had right L3-4 microdiscectomy for recurrent HNP on 02/09/2016 last year. Doppler test was negative for DVT. All vessels were patent. No Baker's cyst was seen behind the knee. Patient complains of pain in her right hip radiating down to the right thigh 6 months. She's been a mature with a walker. She uses Tylenol and states it seems that her pain is gone worse the last month. She had a prescription for Percocet but she's  not been using it. She denies drainage from her incision no erythema, no chills or fever no bowel or bladder associated symptoms. Patient is diabetic. Review of Systems 14 point review of systems positive for history of seizure disorder, hypertension, diabetes, controlled, previous lumbar surgery L3-4 2017.   Objective: Vital Signs: Ht 5\' 4"  (1.626 m)   Wt 300 lb (136.1 kg)   BMI 51.49 kg/m   Physical Exam  Constitutional: She is oriented to person, place, and time. She appears well-developed.  HENT:  Head: Normocephalic.  Right Ear: External ear normal.  Left Ear: External ear normal.  Eyes: Pupils are equal, round, and reactive to light.  Neck: No tracheal deviation present. No thyromegaly present.  Cardiovascular: Normal rate.   Pulmonary/Chest: Effort normal.  Abdominal: Soft.  Musculoskeletal:  Patient has good hip range of motion. He has mild swelling right lower extremity. Negative Homan anterior tib EHL gastrocsoleus is intact. No pain with hip range of motion. Negative reverse straight leg raising. Slight decreased sensation anterior right thigh.  Neurological: She is alert and oriented to person, place, and time.  Skin: Skin is warm and dry.  Psychiatric: She has a normal mood and affect. Her behavior is normal.    Ortho Exam  Specialty Comments:  No specialty comments available.  Imaging: No results found.   PMFS History: Patient Active  Problem List   Diagnosis Date Noted  . History of lumbar laminectomy for spinal cord decompression 02/09/2016  . HNP (herniated nucleus pulposus), lumbar 07/11/2014  . DIABETES MELLITUS, CONTROLLED 01/18/2011  . VAGINITIS 01/18/2011  . MENOPAUSAL SYNDROME 01/18/2011  . FATIGUE 01/18/2011  . HYPERTENSION 09/30/2009  . SEIZURE DISORDER 09/30/2009   Past Medical History:  Diagnosis Date  . Arthritis   . Asthma   . Complication of anesthesia    reports that sometimes she is hard to wake up other time she is not.  2015 Panic  attack and continued to have them for 6 weeks post  op.  . Depression    "husband died Jun 08, 2015"  . GERD (gastroesophageal reflux disease)    intermittent  . History of kidney stones   . Hyperlipidemia   . Hypertension   . Panic attacks 2015   awaking from anesthesia and for 6 weeks after.  Ocassionaly feels painc  . Shortness of breath dyspnea    with activity- "tying shoes'  X"Due to weight gain"  . Trigeminal neuralgia   . Type II diabetes mellitus (HCC)     Family History  Problem Relation Age of Onset  . Hypertension Mother   . Heart attack Mother   . Stroke Father   . Kidney disease Father   . Cancer Sister        breast- in 22's  . Diabetes Other        family hx of  . Hypertension Other        family hx of  . Cancer Other        prostate/ family hx of  . Cancer Sister 86       colon  . Stroke Other   . Heart attack Other     Past Surgical History:  Procedure Laterality Date  . APPENDECTOMY    . BACK SURGERY    . CESAREAN SECTION    . CHOLECYSTECTOMY    . CYSTOSCOPY W/ STONE MANIPULATION    . LUMBAR LAMINECTOMY/DECOMPRESSION MICRODISCECTOMY N/A 07/11/2014   Procedure: MICRODISCECTOMY LUMBAR LAMINECTOMY -L3-L4 BILATERAL MICRODISCECTOMY ;  Surgeon: Eldred Manges, MD;  Location: [facility] OR;  Service: Orthopedics;  Laterality: N/A;  . LUMBAR LAMINECTOMY/DECOMPRESSION MICRODISCECTOMY N/A 02/09/2016   Procedure: Right 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;  Surgeon: Eldred Manges, MD;  Location: [facility] OR;  Service: Orthopedics;  Laterality: N/A;  . TOTAL ABDOMINAL HYSTERECTOMY    . TUBAL LIGATION     Social History   Occupational History  . Not on file.   Social History Main Topics  . Smoking status: Former Smoker    Years: 5.00    Types: Cigarettes  . Smokeless tobacco: Never Used     Comment: smoked as a teenager  . Alcohol use No  . Drug use: No  . Sexual activity: No

## 2017-07-21 ENCOUNTER — Telehealth (INDEPENDENT_AMBULATORY_CARE_PROVIDER_SITE_OTHER): Payer: Self-pay | Admitting: Radiology

## 2017-07-21 MED ORDER — MELOXICAM 15 MG PO TABS: 15.0000 mg | ORAL_TABLET | Freq: Every day | ORAL | 2 refills | Status: DC

## 2017-07-21 NOTE — Telephone Encounter (Signed)
Patient says when she was in last week she told Dr Ophelia CharterYates she did not need any pain meds, and now she says she would like something for pain.  Please call her, thanks.

## 2017-07-21 NOTE — Addendum Note (Signed)
Addended by: Rogers SeedsYEATTS, Maury Groninger M on: 07/21/2017 10:50 AM   Modules accepted: Orders

## 2017-07-21 NOTE — Telephone Encounter (Signed)
Please advise 

## 2017-07-21 NOTE — Telephone Encounter (Signed)
Script entered and sent to pharmacy. 

## 2017-07-21 NOTE — Telephone Encounter (Signed)
Call in meloxicam 15 mg one po daily # 30 refill times 2.   I called her and discussed. thanks

## 2017-10-10 ENCOUNTER — Other Ambulatory Visit (INDEPENDENT_AMBULATORY_CARE_PROVIDER_SITE_OTHER): Payer: Self-pay | Admitting: Orthopaedic Surgery

## 2017-10-10 NOTE — Telephone Encounter (Signed)
Ok for refill? 

## 2017-11-09 ENCOUNTER — Other Ambulatory Visit (INDEPENDENT_AMBULATORY_CARE_PROVIDER_SITE_OTHER): Payer: Self-pay | Admitting: Orthopaedic Surgery

## 2017-11-10 NOTE — Telephone Encounter (Signed)
Rx request 

## 2017-11-17 IMAGING — CR DG LUMBAR SPINE 2-3V
2 series · 2 of 2 positions shown · non-contrast
Comparison: None.

CLINICAL DATA: Right L3-4 microdiscectomy.

EXAM:
LUMBAR SPINE - 2-3 VIEW

[lateral (1 of 2)]
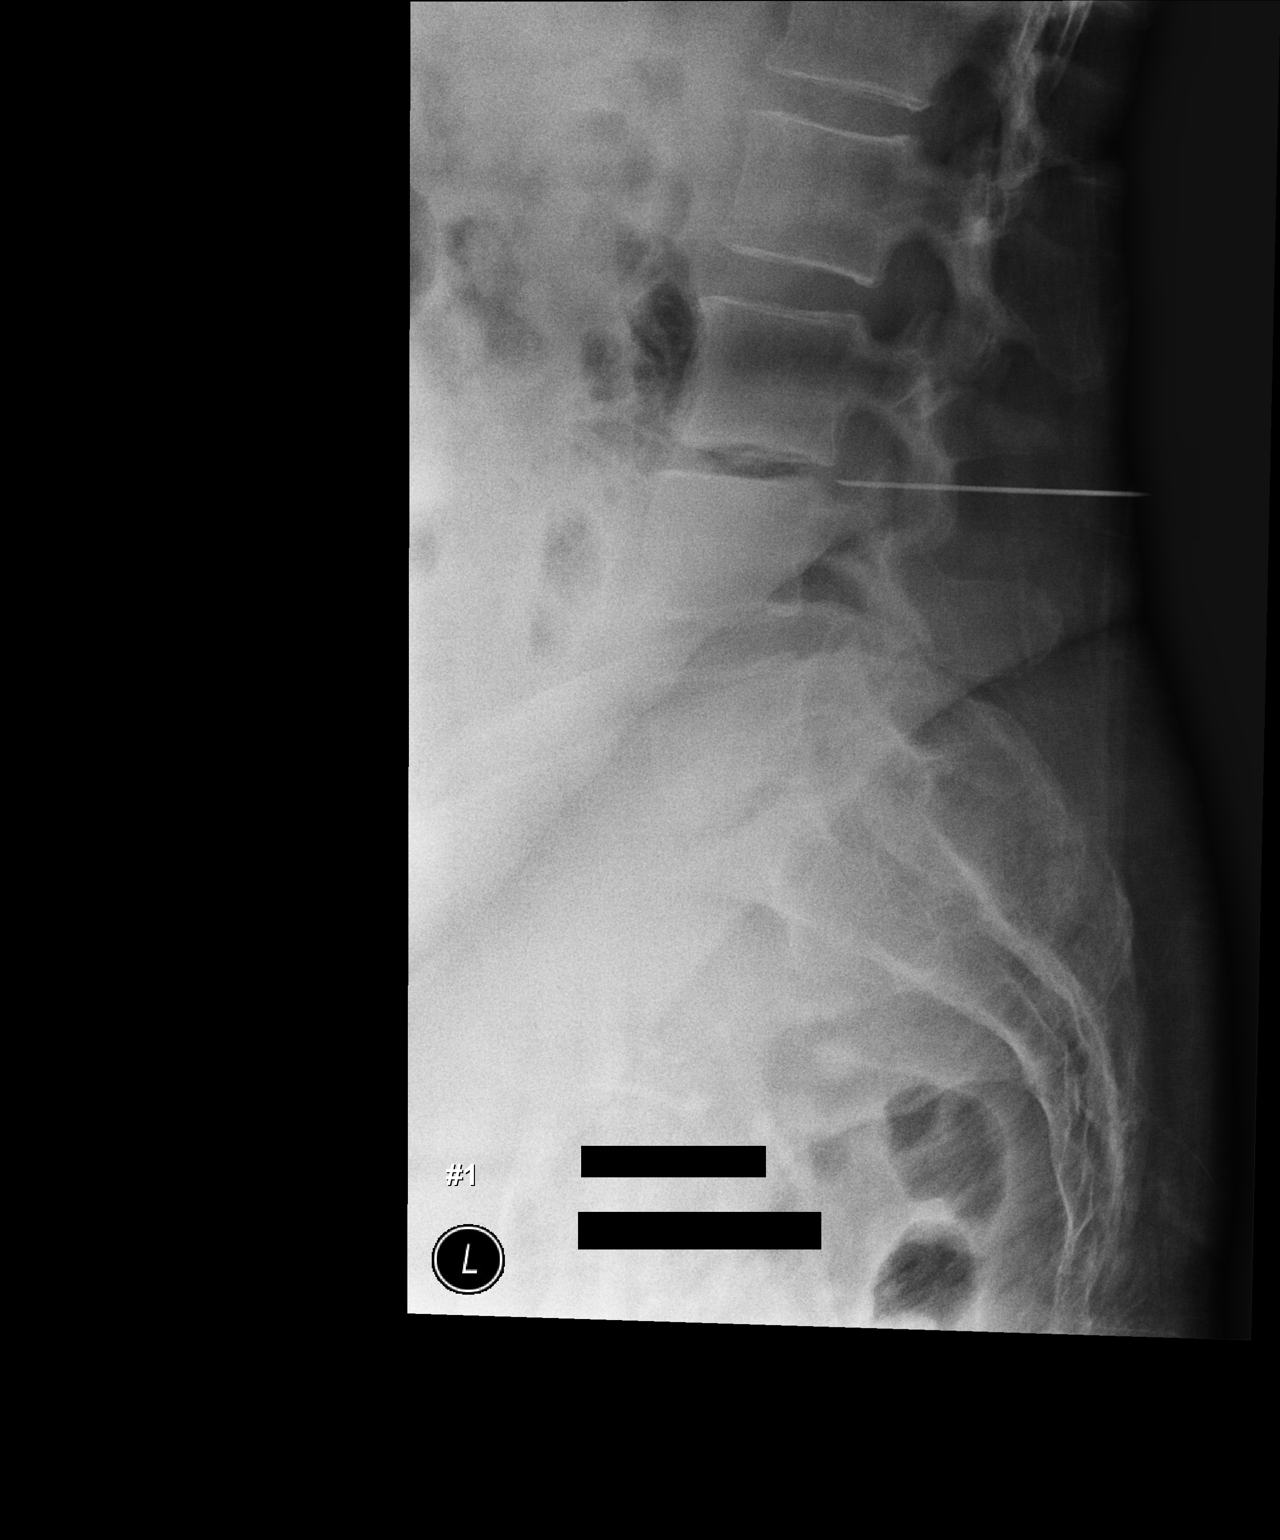

[lateral (2 of 2)]
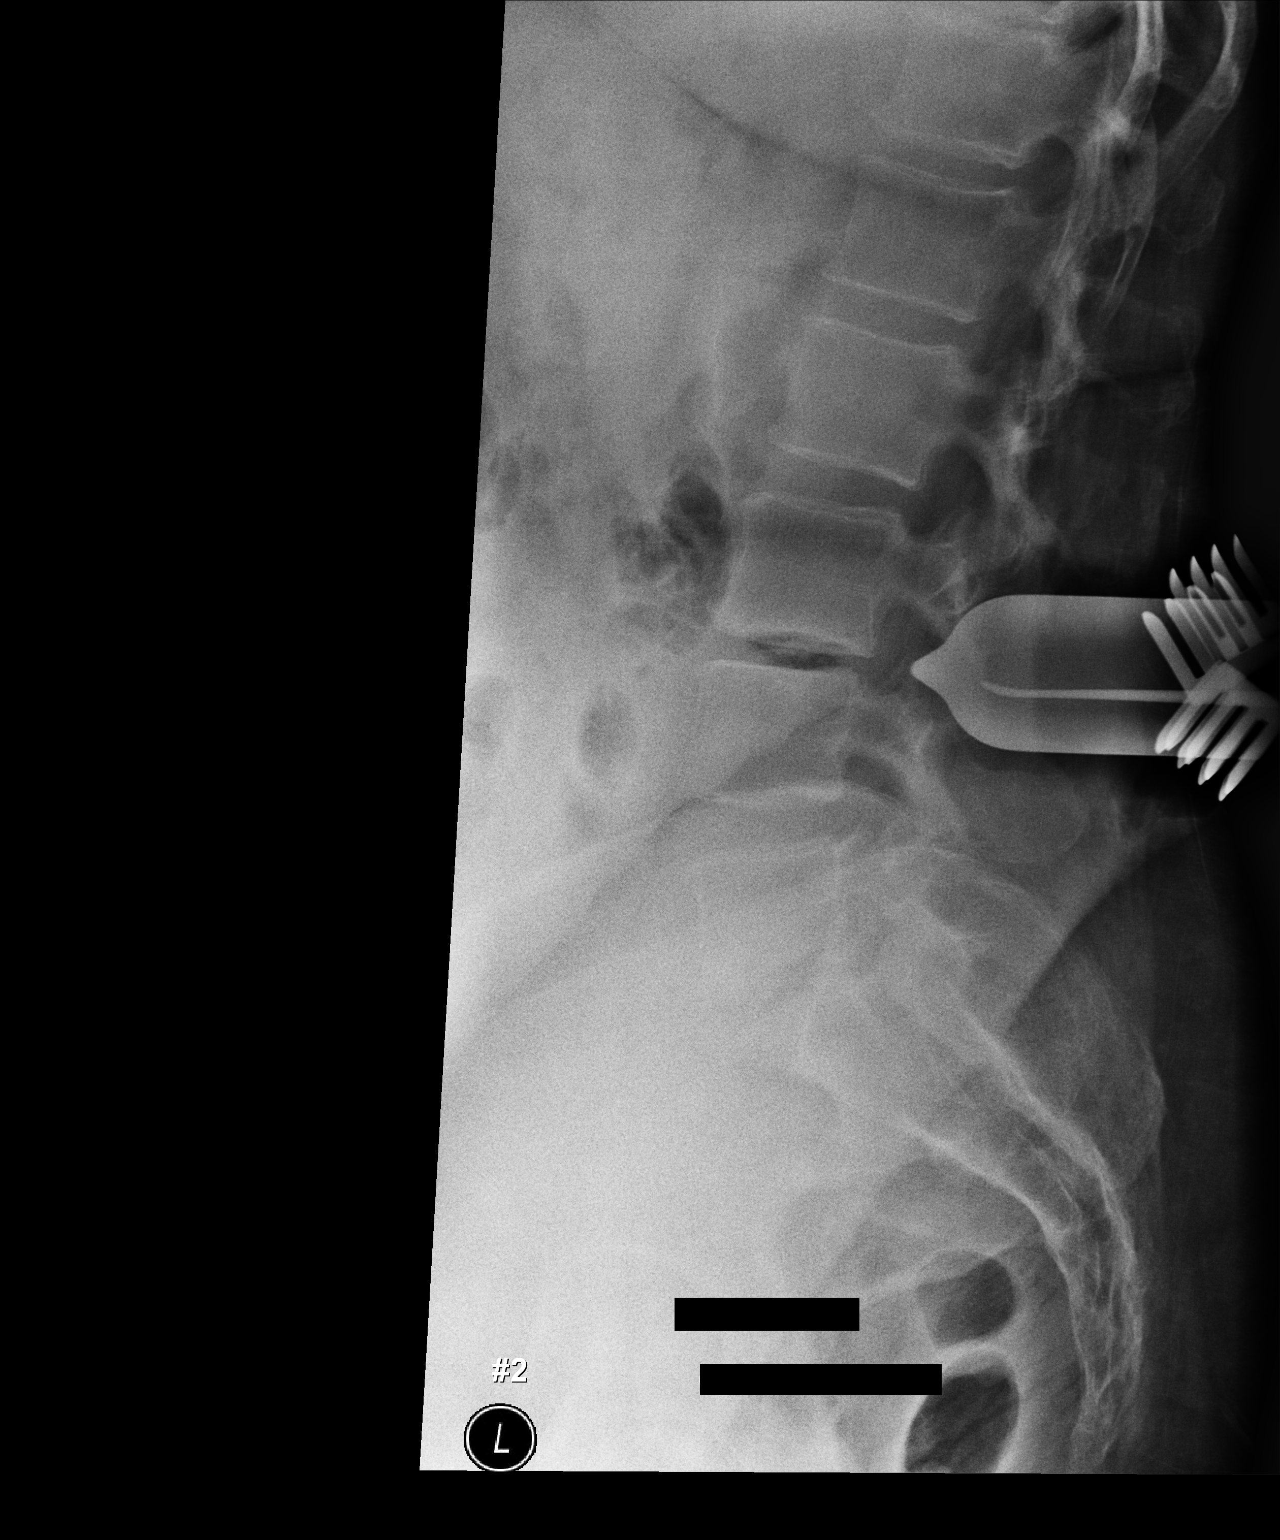

[2 of 2 positions shown; findings below may reference images not displayed]

FINDINGS: Three intraoperative images of the lumbar spine are submitted. The
L3-4 disc space is localized on each image.
IMPRESSION: Intraoperative localization of the L3-4 disc space.

## 2017-12-09 ENCOUNTER — Other Ambulatory Visit (INDEPENDENT_AMBULATORY_CARE_PROVIDER_SITE_OTHER): Payer: Self-pay | Admitting: Orthopaedic Surgery

## 2017-12-09 NOTE — Telephone Encounter (Signed)
Ok for refill? 

## 2018-01-10 ENCOUNTER — Other Ambulatory Visit (INDEPENDENT_AMBULATORY_CARE_PROVIDER_SITE_OTHER): Payer: Self-pay | Admitting: Orthopaedic Surgery

## 2018-01-12 NOTE — Telephone Encounter (Signed)
Ok for refill? 

## 2018-02-14 ENCOUNTER — Other Ambulatory Visit (INDEPENDENT_AMBULATORY_CARE_PROVIDER_SITE_OTHER): Payer: Self-pay | Admitting: Orthopaedic Surgery

## 2018-02-16 NOTE — Telephone Encounter (Signed)
Ok for refill? 

## 2018-03-15 ENCOUNTER — Other Ambulatory Visit (INDEPENDENT_AMBULATORY_CARE_PROVIDER_SITE_OTHER): Payer: Self-pay | Admitting: Orthopaedic Surgery

## 2018-03-16 NOTE — Telephone Encounter (Signed)
Ok for refill? 

## 2018-04-12 ENCOUNTER — Other Ambulatory Visit (INDEPENDENT_AMBULATORY_CARE_PROVIDER_SITE_OTHER): Payer: Self-pay | Admitting: Orthopaedic Surgery

## 2018-04-13 NOTE — Telephone Encounter (Signed)
Ok for refill? 

## 2018-05-12 ENCOUNTER — Other Ambulatory Visit (INDEPENDENT_AMBULATORY_CARE_PROVIDER_SITE_OTHER): Payer: Self-pay | Admitting: Orthopaedic Surgery

## 2018-05-12 NOTE — Telephone Encounter (Signed)
Ok for refill? 

## 2018-06-12 ENCOUNTER — Other Ambulatory Visit (INDEPENDENT_AMBULATORY_CARE_PROVIDER_SITE_OTHER): Payer: Self-pay | Admitting: Orthopaedic Surgery

## 2018-06-12 NOTE — Telephone Encounter (Signed)
Of for refill?

## 2018-07-11 ENCOUNTER — Other Ambulatory Visit (INDEPENDENT_AMBULATORY_CARE_PROVIDER_SITE_OTHER): Payer: Self-pay | Admitting: Orthopaedic Surgery

## 2018-07-14 NOTE — Telephone Encounter (Signed)
Ok for refill? 

## 2018-08-10 ENCOUNTER — Other Ambulatory Visit (INDEPENDENT_AMBULATORY_CARE_PROVIDER_SITE_OTHER): Payer: Self-pay | Admitting: Orthopaedic Surgery

## 2018-08-10 NOTE — Telephone Encounter (Signed)
Ok to rf? 

## 2018-09-09 ENCOUNTER — Other Ambulatory Visit (INDEPENDENT_AMBULATORY_CARE_PROVIDER_SITE_OTHER): Payer: Self-pay | Admitting: Orthopaedic Surgery

## 2018-09-09 NOTE — Telephone Encounter (Signed)
Ok for refill? 

## 2018-10-09 ENCOUNTER — Other Ambulatory Visit (INDEPENDENT_AMBULATORY_CARE_PROVIDER_SITE_OTHER): Payer: Self-pay | Admitting: Orthopaedic Surgery

## 2018-10-12 NOTE — Telephone Encounter (Signed)
Ok for refill? 

## 2018-12-08 ENCOUNTER — Other Ambulatory Visit (INDEPENDENT_AMBULATORY_CARE_PROVIDER_SITE_OTHER): Payer: Self-pay | Admitting: Orthopaedic Surgery

## 2018-12-08 NOTE — Telephone Encounter (Signed)
Ok for refill? 

## 2019-01-05 ENCOUNTER — Other Ambulatory Visit (INDEPENDENT_AMBULATORY_CARE_PROVIDER_SITE_OTHER): Payer: Self-pay | Admitting: Orthopaedic Surgery

## 2019-01-05 NOTE — Telephone Encounter (Signed)
Ok for refill? 

## 2019-01-05 NOTE — Telephone Encounter (Signed)
What we talked about

## 2019-01-31 ENCOUNTER — Other Ambulatory Visit (INDEPENDENT_AMBULATORY_CARE_PROVIDER_SITE_OTHER): Payer: Self-pay | Admitting: Orthopaedic Surgery

## 2019-02-01 NOTE — Telephone Encounter (Signed)
Ok for refill? 

## 2019-02-28 ENCOUNTER — Other Ambulatory Visit (INDEPENDENT_AMBULATORY_CARE_PROVIDER_SITE_OTHER): Payer: Self-pay | Admitting: Orthopaedic Surgery

## 2019-03-01 NOTE — Telephone Encounter (Signed)
Ok to rf? 

## 2019-04-01 ENCOUNTER — Other Ambulatory Visit (INDEPENDENT_AMBULATORY_CARE_PROVIDER_SITE_OTHER): Payer: Self-pay | Admitting: Orthopaedic Surgery

## 2019-04-01 NOTE — Telephone Encounter (Signed)
Please advise. OK for refill? 

## 2019-04-27 ENCOUNTER — Other Ambulatory Visit (INDEPENDENT_AMBULATORY_CARE_PROVIDER_SITE_OTHER): Payer: Self-pay | Admitting: Orthopaedic Surgery

## 2019-04-27 NOTE — Telephone Encounter (Signed)
Ok for refill?  Any additional refills? 

## 2019-05-22 ENCOUNTER — Other Ambulatory Visit (INDEPENDENT_AMBULATORY_CARE_PROVIDER_SITE_OTHER): Payer: Self-pay | Admitting: Orthopaedic Surgery

## 2019-05-24 NOTE — Telephone Encounter (Signed)
Ok to rf? 

## 2019-06-21 ENCOUNTER — Other Ambulatory Visit (INDEPENDENT_AMBULATORY_CARE_PROVIDER_SITE_OTHER): Payer: Self-pay | Admitting: Orthopaedic Surgery

## 2019-06-21 NOTE — Telephone Encounter (Signed)
Ok to rf?y

## 2019-07-21 ENCOUNTER — Other Ambulatory Visit (INDEPENDENT_AMBULATORY_CARE_PROVIDER_SITE_OTHER): Payer: Self-pay | Admitting: Orthopaedic Surgery

## 2019-07-21 NOTE — Telephone Encounter (Signed)
Please advise 

## 2019-08-20 ENCOUNTER — Other Ambulatory Visit (INDEPENDENT_AMBULATORY_CARE_PROVIDER_SITE_OTHER): Payer: Self-pay | Admitting: Family Medicine

## 2019-08-20 ENCOUNTER — Other Ambulatory Visit (INDEPENDENT_AMBULATORY_CARE_PROVIDER_SITE_OTHER): Payer: Self-pay | Admitting: Orthopaedic Surgery

## 2019-08-20 NOTE — Telephone Encounter (Signed)
Can you advise since Dr. Lorin Mercy is out of office?

## 2019-10-18 ENCOUNTER — Other Ambulatory Visit: Payer: Self-pay | Admitting: Family Medicine

## 2019-10-18 MED ORDER — MELOXICAM 15 MG PO TABS: 15.0000 mg | ORAL_TABLET | Freq: Every day | ORAL | 1 refills | Status: DC | PRN

## 2020-04-16 ENCOUNTER — Other Ambulatory Visit: Payer: Self-pay | Admitting: Family Medicine

## 2021-06-19 ENCOUNTER — Other Ambulatory Visit: Payer: Self-pay | Admitting: Family Medicine

## 2021-07-20 ENCOUNTER — Ambulatory Visit (INDEPENDENT_AMBULATORY_CARE_PROVIDER_SITE_OTHER): Payer: Medicare PPO

## 2021-07-20 ENCOUNTER — Other Ambulatory Visit: Payer: Self-pay

## 2021-07-20 ENCOUNTER — Ambulatory Visit: Payer: Medicare Other | Admitting: Family

## 2021-07-20 ENCOUNTER — Encounter: Payer: Self-pay | Admitting: Surgical

## 2021-07-20 ENCOUNTER — Ambulatory Visit: Payer: Medicare PPO | Admitting: Surgical

## 2021-07-20 DIAGNOSIS — M25551 Pain in right hip: Secondary | ICD-10-CM | POA: Diagnosis not present

## 2021-07-20 DIAGNOSIS — M4316 Spondylolisthesis, lumbar region: Secondary | ICD-10-CM

## 2021-07-20 DIAGNOSIS — M545 Low back pain, unspecified: Secondary | ICD-10-CM

## 2021-07-20 MED ORDER — PREDNISONE 10 MG (21) PO TBPK: ORAL_TABLET | ORAL | 0 refills | Status: DC

## 2021-07-21 ENCOUNTER — Encounter: Payer: Self-pay | Admitting: Surgical

## 2021-07-21 NOTE — Progress Notes (Signed)
Office Visit Note   Patient: Traci Sullivan           Date of Birth: [birth year]           MRN: [id number] Visit Date: 07/20/2021 Requested by: Juluis Rainier, MD [address] [place],  [place] [address] PCP: Juluis Rainier, MD  Subjective: Chief Complaint  Patient presents with   Right Thigh - Follow-up    HPI: Traci Sullivan is a 75 y.o. female who presents to the office complaining of right leg pain.  She has had worsening right leg pain specifically since last Thursday but it has been present for about the last 2 weeks.  She localizes pain to the lateral thigh that travels down her leg about to the knee.  Does not extend past the knee.  She has associated numbness and tingling in this distribution.  No recent worsening of her chronic back pain.  She does wake with pain from this leg pain.  She does complain of weakness reporting "I cannot stand too long or I get wobbly".  She states it only takes a couple minutes before she begins to feel like she is going to fall.  Denies any groin pain.  She ambulates with a walker at baseline.  Does report history of diabetes but this is well controlled with last A1c 5.4.  She has history of multiple back surgeries by Dr. Ophelia Charter.  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  No other back procedures.  She denies any bowel or bladder incontinence or saddle anesthesia.  She is taking over-the-counter Tylenol for her pain which is not controlling it.              ROS: All systems reviewed are negative as they relate to the chief complaint within the history of present illness.  Patient denies fevers or chills.  Assessment & Plan: Visit Diagnoses:  1. Spondylolisthesis at L2-L3 level   2. Pain in right hip   3. Low back pain, unspecified back pain laterality, unspecified chronicity, unspecified whether sciatica present     Plan: Patient is a 75 year old  female who presents for evaluation of right leg pain.  She has pain that is traveling down the lateral thigh with associated numbness and tingling and weakness when she tries to stand for long periods of time.  Pain is bothering her at night and waking her up.  She has history of tube prior spine surgeries by Dr. Ophelia Charter with most recent surgery in March 2017.  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  Today, she has new radiographs of the right hip that are unremarkable.  Radiographs of the lumbar spine did reveal lateral listhesis at L2-L3 which is new changes prior radiographs.  Plan to order MRI of the lumbar spine for further evaluation and likely follow-up with Dr. Ophelia Charter after MRI scan.  Prescribed Medrol Dosepak for symptomatic relief in the meantime.  No red flag symptoms on today's visit..  Follow-Up Instructions: No follow-ups on file.   Orders:  Orders Placed This Encounter  Procedures   XR HIP UNILAT W OR W/O PELVIS 2-3 VIEWS RIGHT   XR Lumbar Spine 2-3 Views   MR Lumbar Spine w/o contrast   Meds ordered this encounter  Medications   predniSONE (STERAPRED UNI-PAK 21 TAB) 10 MG (21) TBPK tablet  Sig: Take as directed on packaging    Dispense:  21 tablet    Refill:  0      Procedures: No procedures performed   Clinical Data: No additional findings.  Objective: Vital Signs: There were no vitals taken for this visit.  Physical Exam:  Constitutional: Patient appears well-developed HEENT:  Head: Normocephalic Eyes:EOM are normal Neck: Normal range of motion Cardiovascular: Normal rate Pulmonary/chest: Effort normal Neurologic: Patient is alert Skin: Skin is warm Psychiatric: Patient has normal mood and affect  Ortho Exam: Ortho exam demonstrates right leg with positive straight leg raise.  No pain with hip range of motion.  Negative Stinchfield sign.  No calf tenderness.   Negative Homans' sign.  She has no evidence of clonus bilaterally.  4/5 motor strength of right hamstring compared with 5/5 motor strength of left hamstring.  5 -/5 motor strength of bilateral hip flexion, quadricep.  5/5 motor strength of bilateral dorsiflexion and plantarflexion.  Negative Hoffmann sign bilaterally.  Specialty Comments:  No specialty comments available.  Imaging: No results found.   PMFS History: Patient Active Problem List   Diagnosis Date Noted   History of lumbar laminectomy for spinal cord decompression 02/09/2016   HNP (herniated nucleus pulposus), lumbar 07/11/2014   DIABETES MELLITUS, CONTROLLED 01/18/2011   VAGINITIS 01/18/2011   MENOPAUSAL SYNDROME 01/18/2011   FATIGUE 01/18/2011   HYPERTENSION 09/30/2009   SEIZURE DISORDER 09/30/2009   Past Medical History:  Diagnosis Date   Arthritis    Asthma    Complication of anesthesia    reports that sometimes she is hard to wake up other time she is not.  2015 Panic attack and continued to have them for 6 weeks post  op.   Depression    "husband died June 20, 2015"   GERD (gastroesophageal reflux disease)    intermittent   History of kidney stones    Hyperlipidemia    Hypertension    Panic attacks 2015   awaking from anesthesia and for 6 weeks after.  Ocassionaly feels painc   Shortness of breath dyspnea    with activity- "tying shoes'  X"Due to weight gain"   Trigeminal neuralgia    Type II diabetes mellitus (HCC)     Family History  Problem Relation Age of Onset   Hypertension Mother    Heart attack Mother    Stroke Father    Kidney disease Father    Cancer Sister        breast- in 49's   Diabetes Other        family hx of   Hypertension Other        family hx of   Cancer Other        prostate/ family hx of   Cancer Sister 54       colon   Stroke Other    Heart attack Other     Past Surgical History:  Procedure Laterality Date   APPENDECTOMY     BACK SURGERY     CESAREAN SECTION      CHOLECYSTECTOMY     CYSTOSCOPY W/ STONE MANIPULATION     LUMBAR LAMINECTOMY/DECOMPRESSION MICRODISCECTOMY N/A 07/11/2014   Procedure: MICRODISCECTOMY LUMBAR LAMINECTOMY -L3-L4 BILATERAL MICRODISCECTOMY ;  Surgeon: Eldred Manges, MD;  Location: [facility] OR;  Service: Orthopedics;  Laterality: N/A;   LUMBAR LAMINECTOMY/DECOMPRESSION MICRODISCECTOMY N/A 02/09/2016   Procedure: Right 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;  Surgeon: Eldred Manges, MD;  Location: [facility] OR;  Service: Orthopedics;  Laterality:  N/A;   TOTAL ABDOMINAL HYSTERECTOMY     TUBAL LIGATION     Social History   Occupational History   Not on file  Tobacco Use   Smoking status: Former    Years: 5.00    Types: Cigarettes   Smokeless tobacco: Never   Tobacco comments:    smoked as a teenager  Substance and Sexual Activity   Alcohol use: No   Drug use: No   Sexual activity: Never

## 2021-09-10 ENCOUNTER — Telehealth: Payer: Self-pay | Admitting: Radiology

## 2021-09-10 ENCOUNTER — Other Ambulatory Visit: Payer: Self-pay | Admitting: Orthopaedic Surgery

## 2021-09-10 MED ORDER — TRAMADOL HCL 50 MG PO TABS: 50.0000 mg | ORAL_TABLET | Freq: Three times a day (TID) | ORAL | 0 refills | Status: DC | PRN

## 2021-09-10 NOTE — Telephone Encounter (Signed)
Patients daughter is aware

## 2021-09-10 NOTE — Telephone Encounter (Signed)
Patient is following up with [place] on Friday 09/14/2021 to review MRI lumbar spine. She is having significant pain, most especially down her leg, and Meloxicam is not touching it. She is requesting something for the pain that she is having until follow up in the office.  Please advise. Patient uses [facility] in [place].

## 2021-09-14 ENCOUNTER — Ambulatory Visit: Payer: Medicare PPO | Admitting: Orthopaedic Surgery

## 2021-09-14 ENCOUNTER — Other Ambulatory Visit: Payer: Self-pay

## 2021-09-14 ENCOUNTER — Encounter: Payer: Self-pay | Admitting: Orthopaedic Surgery

## 2021-09-14 VITALS — BP 147/74 | HR 80 | Ht 62.0 in | Wt 200.0 lb

## 2021-09-14 DIAGNOSIS — M545 Low back pain, unspecified: Secondary | ICD-10-CM

## 2021-09-14 DIAGNOSIS — M5126 Other intervertebral disc displacement, lumbar region: Secondary | ICD-10-CM | POA: Diagnosis not present

## 2021-09-14 NOTE — Progress Notes (Signed)
Office Visit Note   Patient: Traci Sullivan           Date of Birth: August 31, 1946           MRN: [id number] Visit Date: 09/14/2021              Requested by: Juluis Rainier, MD [address] [place],  [place] [address] PCP: Juluis Rainier, MD   Assessment & Plan: Visit Diagnoses:  1. Low back pain, unspecified back pain laterality, unspecified chronicity, unspecified whether sciatica present   2. HNP (herniated nucleus pulposus), lumbar     Plan: We will set up for a epidural injection right L3-4 with Dr. Alvester Morin.  I discussed with her that with her significant degeneration surgical stabilization would require a multilevel procedure with instrumentation and we can have her review this with Dr. Weston Brass gives epidurals not successful.  Follow-Up Instructions: No follow-ups on file.   Orders:  Orders Placed This Encounter  Procedures   Ambulatory referral to Physical Medicine Rehab   No orders of the defined types were placed in this encounter.     Procedures: No procedures performed   Clinical Data: No additional findings.   Subjective: Chief Complaint  Patient presents with   Lower Back - Pain, Follow-up    MRI lumbar review    HPI 75 year old female returns with ongoing problems with back pain leg pain worse in the right.  She has foraminal L3-4 HNP with some compression and persistent symptoms.  She has tried tramadol meloxicam.  She brought her scan in with CD today for review.  Patient has pain and cannot stand long cannot walk far and feels like her leg will give way.  Last A1c was 5.4.  MRI at [facility] showed foraminal right L3-4 HNP.  She had considerable endplate irregularity L3-4 and L4-5 with some lateral shifting worst at L2-3.  Some reversal of normal lumbar lordosis significant collapse of disc space height at L3-4 and L4-5  Review of Systems past problems of seizure disorder hypertension diabetes.  Previous lumbar surgery L3-4 2015 and 2017  right L3-4 for recurrent HNP.  All the systems noncontributory to HPI.   Objective: Vital Signs: BP (!) 147/74   Pulse 80   Ht 5\' 2"  (1.575 m)   Wt 200 lb (90.7 kg)   BMI 36.58 kg/m   Physical Exam Constitutional:      Appearance: She is well-developed.  HENT:     Head: Normocephalic.     Right Ear: External ear normal.     Left Ear: External ear normal. There is no impacted cerumen.  Eyes:     Pupils: Pupils are equal, round, and reactive to light.  Neck:     Thyroid: No thyromegaly.     Trachea: No tracheal deviation.  Cardiovascular:     Rate and Rhythm: Normal rate.  Pulmonary:     Effort: Pulmonary effort is normal.  Abdominal:     Palpations: Abdomen is soft.  Musculoskeletal:     Cervical back: No rigidity.  Skin:    General: Skin is warm and dry.  Neurological:     Mental Status: She is alert and oriented to person, place, and time.  Psychiatric:        Behavior: Behavior normal.    Ortho Exam patient has some sciatic notch tenderness on the right.  Healed lumbar incision.  No rash over exposed skin.  Specialty Comments:  No specialty comments available.  Imaging: Impression  IMPRESSION:  1.  Prior RIGHT hemilaminectomy at L3-L4.  2.  Multilevel degenerative disc disease, facet arthrosis and subluxations as described above. This is most notable for severe RIGHT and moderate LEFT neuroforaminal stenosis and moderate LEFT lateral recess narrowing at L3-L4.  3.  Mild to moderate bilateral neuroforaminal stenosis at L5-S1.   Electronically Signed by: Ardyth Man on 09/10/2021 9:29 AM   PMFS History: Patient Active Problem List   Diagnosis Date Noted   History of lumbar laminectomy for spinal cord decompression 02/09/2016   HNP (herniated nucleus pulposus), lumbar 07/11/2014   DIABETES MELLITUS, CONTROLLED 01/18/2011   VAGINITIS 01/18/2011   MENOPAUSAL SYNDROME 01/18/2011   FATIGUE 01/18/2011   HYPERTENSION 09/30/2009   SEIZURE DISORDER 09/30/2009    Past Medical History:  Diagnosis Date   Arthritis    Asthma    Complication of anesthesia    reports that sometimes she is hard to wake up other time she is not.  2015 Panic attack and continued to have them for 6 weeks post  op.   Depression    "husband died June 03, 2015"   GERD (gastroesophageal reflux disease)    intermittent   History of kidney stones    Hyperlipidemia    Hypertension    Panic attacks 2015   awaking from anesthesia and for 6 weeks after.  Ocassionaly feels painc   Shortness of breath dyspnea    with activity- "tying shoes'  X"Due to weight gain"   Trigeminal neuralgia    Type II diabetes mellitus (HCC)     Family History  Problem Relation Age of Onset   Hypertension Mother    Heart attack Mother    Stroke Father    Kidney disease Father    Cancer Sister        breast- in 31's   Diabetes Other        family hx of   Hypertension Other        family hx of   Cancer Other        prostate/ family hx of   Cancer Sister 94       colon   Stroke Other    Heart attack Other     Past Surgical History:  Procedure Laterality Date   APPENDECTOMY     BACK SURGERY     CESAREAN SECTION     CHOLECYSTECTOMY     CYSTOSCOPY W/ STONE MANIPULATION     LUMBAR LAMINECTOMY/DECOMPRESSION MICRODISCECTOMY N/A 07/11/2014   Procedure: MICRODISCECTOMY LUMBAR LAMINECTOMY -L3-L4 BILATERAL MICRODISCECTOMY ;  Surgeon: Eldred Manges, MD;  Location: [facility] OR;  Service: Orthopedics;  Laterality: N/A;   LUMBAR LAMINECTOMY/DECOMPRESSION MICRODISCECTOMY N/A 02/09/2016   Procedure: Right 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;  Surgeon: Eldred Manges, MD;  Location: [facility] OR;  Service: Orthopedics;  Laterality: N/A;   TOTAL ABDOMINAL HYSTERECTOMY     TUBAL LIGATION     Social History   Occupational History   Not on file  Tobacco Use   Smoking status: Former    Years: 5.00    Types: Cigarettes   Smokeless tobacco: Never   Tobacco comments:    smoked as a teenager   Substance and Sexual Activity   Alcohol use: No   Drug use: No   Sexual activity: Never

## 2021-09-21 ENCOUNTER — Ambulatory Visit: Payer: Medicare PPO | Admitting: Orthopaedic Surgery

## 2021-09-25 ENCOUNTER — Telehealth: Payer: Self-pay | Admitting: Radiology

## 2021-09-25 MED ORDER — TRAMADOL HCL 50 MG PO TABS: 50.0000 mg | ORAL_TABLET | Freq: Three times a day (TID) | ORAL | 0 refills | Status: DC | PRN

## 2021-09-25 NOTE — Telephone Encounter (Signed)
Called to pharmacy 

## 2021-09-25 NOTE — Telephone Encounter (Signed)
Refill request for tramadol 50mg . Patient has upcoming appointment with Dr. on 09/28/2021. Please advise.

## 2021-09-25 NOTE — Addendum Note (Signed)
Addended by: Rogers Seeds on: 09/25/2021 11:26 AM   Modules accepted: Orders

## 2021-09-28 ENCOUNTER — Ambulatory Visit: Payer: Self-pay

## 2021-09-28 ENCOUNTER — Other Ambulatory Visit: Payer: Self-pay

## 2021-09-28 ENCOUNTER — Ambulatory Visit (INDEPENDENT_AMBULATORY_CARE_PROVIDER_SITE_OTHER): Payer: Medicare PPO | Admitting: Physical Medicine and Rehabilitation

## 2021-09-28 ENCOUNTER — Encounter: Payer: Self-pay | Admitting: Physical Medicine and Rehabilitation

## 2021-09-28 VITALS — BP 127/78 | HR 89

## 2021-09-28 DIAGNOSIS — M5416 Radiculopathy, lumbar region: Secondary | ICD-10-CM

## 2021-09-28 MED ORDER — METHYLPREDNISOLONE ACETATE 80 MG/ML IJ SUSP
80.0000 mg | Freq: Once | INTRAMUSCULAR | Status: AC
Start: 2021-09-28 — End: 2021-09-28
  Administered 2021-09-28: 11:00:00 80 mg

## 2021-09-28 NOTE — Patient Instructions (Signed)

## 2021-09-28 NOTE — Progress Notes (Signed)
Pt state lower back pain that travels down her right ankle. Pt state walking and standing makes worse. Pt state she takes pain meds and uses heating pad to help ease her pain.  Numeric Pain Rating Scale and Functional Assessment Average Pain 5   In the last MONTH (on 0-10 scale) has pain interfered with the following?  1. General activity like being  able to carry out your everyday physical activities such as walking, climbing stairs, carrying groceries, or moving a chair?  Rating(9)   +Driver, -BT, -Dye Allergies.

## 2021-10-02 ENCOUNTER — Ambulatory Visit: Payer: Medicare PPO | Admitting: Physical Medicine and Rehabilitation

## 2021-10-08 NOTE — Progress Notes (Signed)
Traci Sullivan - 75 y.o. female MRN [id number]  Date of birth: 1946-09-16  Office Visit Note: Visit Date: 09/28/2021 PCP: Juluis Rainier, MD (Inactive) Referred by: Juluis Rainier, MD  Subjective: Chief Complaint  Patient presents with   Lower Back - Pain   Right Ankle - Pain   HPI:  Traci Sullivan is a 75 y.o. female who comes in today at the request of Dr. Annell Greening for planned Right L3-4 Lumbar Transforaminal epidural steroid injection with fluoroscopic guidance.  The patient has failed conservative care including home exercise, medications, time and activity modification.  This injection will be diagnostic and hopefully therapeutic.  Please see requesting physician notes for further details and justification.   ROS Otherwise per HPI.  Assessment & Plan: Visit Diagnoses:    ICD-10-CM   1. Lumbar radiculopathy  M54.16 XR C-ARM NO REPORT    Epidural Steroid injection    methylPREDNISolone acetate (DEPO-MEDROL) injection 80 mg      Plan: No additional findings.   Meds & Orders:  Meds ordered this encounter  Medications   methylPREDNISolone acetate (DEPO-MEDROL) injection 80 mg    Orders Placed This Encounter  Procedures   XR C-ARM NO REPORT   Epidural Steroid injection    Follow-up: Return for visit to requesting provider as needed.   Procedures: No procedures performed  Lumbosacral Transforaminal Epidural Steroid Injection - Sub-Pedicular Approach with Fluoroscopic Guidance  Patient: Traci Sullivan      Date of Birth: 04/07/1946 MRN: [id number] PCP: Juluis Rainier, MD (Inactive)      Visit Date: 09/28/2021   Universal Protocol:    Date/Time: 09/28/2021  Consent Given By: the patient  Position: PRONE  Additional Comments: Vital signs were monitored before and after the procedure. Patient was prepped and draped in the usual sterile fashion. The correct patient, procedure, and site was verified.   Injection Procedure Details:   Procedure  diagnoses: Lumbar radiculopathy [M54.16]    Meds Administered:  Meds ordered this encounter  Medications   methylPREDNISolone acetate (DEPO-MEDROL) injection 80 mg    Laterality: Right  Location/Site: L3  Needle:5.0 in., 22 ga.  Short bevel or Quincke spinal needle  Needle Placement: Transforaminal  Findings:    -Comments: Excellent flow of contrast along the nerve, nerve root and into the epidural space.  Procedure Details: After squaring off the end-plates to get a true AP view, the C-arm was positioned so that an oblique view of the foramen as noted above was visualized. The target area is just inferior to the "nose of the scotty dog" or sub pedicular. The soft tissues overlying this structure were infiltrated with 2-3 ml. of 1% Lidocaine without Epinephrine.  The spinal needle was inserted toward the target using a "trajectory" view along the fluoroscope beam.  Under AP and lateral visualization, the needle was advanced so it did not puncture dura and was located close the 6 O'Clock position of the pedical in AP tracterory. Biplanar projections were used to confirm position. Aspiration was confirmed to be negative for CSF and/or blood. A 1-2 ml. volume of Isovue-250 was injected and flow of contrast was noted at each level. Radiographs were obtained for documentation purposes.   After attaining the desired flow of contrast documented above, a 0.5 to 1.0 ml test dose of 0.25% Marcaine was injected into each respective transforaminal space.  The patient was observed for 90 seconds post injection.  After no sensory deficits were reported, and normal lower extremity motor function was noted,  the above injectate was administered so that equal amounts of the injectate were placed at each foramen (level) into the transforaminal epidural space.   Additional Comments:  No complications occurred Dressing: 2 x 2 sterile gauze and Band-Aid    Post-procedure details: Patient was observed  during the procedure. Post-procedure instructions were reviewed.  Patient left the clinic in stable condition.   Clinical History: No specialty comments available.     Objective:  VS:  HT:    WT:   BMI:     BP:127/78  HR:89bpm  TEMP: ( )  RESP:  Physical Exam Vitals and nursing note reviewed.  Constitutional:      General: She is not in acute distress.    Appearance: Normal appearance. She is well-developed. She is not ill-appearing.  HENT:     Head: Normocephalic and atraumatic.     Right Ear: External ear normal.     Left Ear: External ear normal.  Eyes:     Extraocular Movements: Extraocular movements intact.     Conjunctiva/sclera: Conjunctivae normal.     Pupils: Pupils are equal, round, and reactive to light.  Cardiovascular:     Rate and Rhythm: Normal rate.     Pulses: Normal pulses.  Pulmonary:     Effort: Pulmonary effort is normal. No respiratory distress.  Abdominal:     General: There is no distension.     Palpations: Abdomen is soft.  Musculoskeletal:        General: Tenderness present.     Cervical back: Neck supple.     Right lower leg: No edema.     Left lower leg: No edema.     Comments: Patient has good distal strength with no pain over the greater trochanters.  No clonus or focal weakness.  Skin:    General: Skin is warm and dry.     Findings: No erythema, lesion or rash.  Neurological:     General: No focal deficit present.     Mental Status: She is alert and oriented to person, place, and time.     Sensory: No sensory deficit.     Motor: No weakness or abnormal muscle tone.     Coordination: Coordination normal.     Gait: Gait normal.  Psychiatric:        Mood and Affect: Mood normal.        Behavior: Behavior normal.     Imaging: No results found.

## 2021-10-08 NOTE — Procedures (Signed)
Lumbosacral Transforaminal Epidural Steroid Injection - Sub-Pedicular Approach with Fluoroscopic Guidance  Patient: Traci Sullivan      Date of Birth: [birth year] MRN: [id number] PCP: Juluis Rainier, MD (Inactive)      Visit Date: 09/28/2021   Universal Protocol:    Date/Time: 09/28/2021  Consent Given By: the patient  Position: PRONE  Additional Comments: Vital signs were monitored before and after the procedure. Patient was prepped and draped in the usual sterile fashion. The correct patient, procedure, and site was verified.   Injection Procedure Details:   Procedure diagnoses: Lumbar radiculopathy [M54.16]    Meds Administered:  Meds ordered this encounter  Medications   methylPREDNISolone acetate (DEPO-MEDROL) injection 80 mg    Laterality: Right  Location/Site: L3  Needle:5.0 in., 22 ga.  Short bevel or Quincke spinal needle  Needle Placement: Transforaminal  Findings:    -Comments: Excellent flow of contrast along the nerve, nerve root and into the epidural space.  Procedure Details: After squaring off the end-plates to get a true AP view, the C-arm was positioned so that an oblique view of the foramen as noted above was visualized. The target area is just inferior to the "nose of the scotty dog" or sub pedicular. The soft tissues overlying this structure were infiltrated with 2-3 ml. of 1% Lidocaine without Epinephrine.  The spinal needle was inserted toward the target using a "trajectory" view along the fluoroscope beam.  Under AP and lateral visualization, the needle was advanced so it did not puncture dura and was located close the 6 O'Clock position of the pedical in AP tracterory. Biplanar projections were used to confirm position. Aspiration was confirmed to be negative for CSF and/or blood. A 1-2 ml. volume of Isovue-250 was injected and flow of contrast was noted at each level. Radiographs were obtained for documentation purposes.   After attaining the  desired flow of contrast documented above, a 0.5 to 1.0 ml test dose of 0.25% Marcaine was injected into each respective transforaminal space.  The patient was observed for 90 seconds post injection.  After no sensory deficits were reported, and normal lower extremity motor function was noted,   the above injectate was administered so that equal amounts of the injectate were placed at each foramen (level) into the transforaminal epidural space.   Additional Comments:  No complications occurred Dressing: 2 x 2 sterile gauze and Band-Aid    Post-procedure details: Patient was observed during the procedure. Post-procedure instructions were reviewed.  Patient left the clinic in stable condition.

## 2024-01-15 DIAGNOSIS — F0390 Unspecified dementia without behavioral disturbance: Secondary | ICD-10-CM | POA: Insufficient documentation

## 2024-01-15 DIAGNOSIS — E559 Vitamin D deficiency, unspecified: Secondary | ICD-10-CM | POA: Insufficient documentation

## 2024-01-15 DIAGNOSIS — M6282 Rhabdomyolysis: Secondary | ICD-10-CM | POA: Insufficient documentation

## 2024-01-16 LAB — COMPREHENSIVE METABOLIC PANEL WITH GFR: eGFR: 69

## 2024-01-16 LAB — HEMOGLOBIN A1C: Hemoglobin A1C: 8.3

## 2024-03-23 ENCOUNTER — Ambulatory Visit: Admitting: Family Medicine

## 2024-03-23 ENCOUNTER — Encounter: Payer: Self-pay | Admitting: Family Medicine

## 2024-03-23 ENCOUNTER — Other Ambulatory Visit: Payer: Self-pay | Admitting: Family Medicine

## 2024-03-23 VITALS — BP 123/74 | HR 79 | Temp 98.2°F | Resp 18 | Ht 60.0 in | Wt 325.0 lb

## 2024-03-23 DIAGNOSIS — F418 Other specified anxiety disorders: Secondary | ICD-10-CM | POA: Diagnosis not present

## 2024-03-23 DIAGNOSIS — R5381 Other malaise: Secondary | ICD-10-CM

## 2024-03-23 DIAGNOSIS — Z7689 Persons encountering health services in other specified circumstances: Secondary | ICD-10-CM

## 2024-03-23 DIAGNOSIS — R32 Unspecified urinary incontinence: Secondary | ICD-10-CM | POA: Diagnosis not present

## 2024-03-23 DIAGNOSIS — Z9181 History of falling: Secondary | ICD-10-CM

## 2024-03-23 DIAGNOSIS — F03A0 Unspecified dementia, mild, without behavioral disturbance, psychotic disturbance, mood disturbance, and anxiety: Secondary | ICD-10-CM | POA: Diagnosis not present

## 2024-03-23 MED ORDER — SERTRALINE HCL 25 MG PO TABS
25.0000 mg | ORAL_TABLET | Freq: Every day | ORAL | 0 refills | Status: DC
Start: 2024-03-23 — End: 2024-06-17

## 2024-03-23 MED ORDER — MYRBETRIQ 50 MG PO TB24: 50.0000 mg | ORAL_TABLET | Freq: Every day | ORAL | 0 refills | Status: DC

## 2024-03-23 NOTE — Progress Notes (Signed)
 New Patient Office Visit  Subjective    Patient ID: Traci Sullivan, female    DOB: 01-Dec-1945  Age: 78 y.o. MRN: [id number]  CC:  Chief Complaint  Patient presents with   Establish Care    Patient is here to establish care with a new PCP, Patient is here to just to discuss medical care    HPI Traci Sullivan presents to establish care. Pt is here here with daughter.   She was admitted to the hospital for rhabdo for 5 days. She then went to PT for 3 weeks.  She just got home a few weeks ago. A nephew, along with daughter and her husband all help out with her care. Pt has been using a walker for the last few years.  She is in wheelchair today. She has hx of dementia and is needing assistance with ambulation. Daughter requests home health PT.   Pt is using Myrbetriq 50mg  daily for urinary incontinence. They need this refilled.  She has hx of depression/anxiety. She has zoloft 25mg  daily that she needs refilled.    Outpatient Encounter Medications as of 03/23/2024  Medication Sig   aspirin  81 MG tablet Take 81 mg by mouth daily.     atorvastatin  (LIPITOR) 40 MG tablet Take 40 mg by mouth daily.   cyanocobalamin (VITAMIN B12) 500 MCG tablet Take 500 mcg by mouth daily.   hydrochlorothiazide (HYDRODIURIL) 12.5 MG tablet Take 12.5 mg by mouth daily.   losartan  (COZAAR ) 50 MG tablet Take 50 mg by mouth daily.   meloxicam  (MOBIC ) 15 MG tablet TAKE 1 TABLET(15 MG) BY MOUTH DAILY AS NEEDED FOR PAIN   metFORMIN (GLUCOPHAGE) 1000 MG tablet Take 1,000 mg by mouth 2 (two) times daily.   MYRBETRIQ 50 MG TB24 tablet Take 50 mg by mouth daily.   sertraline (ZOLOFT) 25 MG tablet Take 25 mg by mouth daily.   [DISCONTINUED] atorvastatin  (LIPITOR) 20 MG tablet Take 20 mg by mouth daily at 6 PM.    [DISCONTINUED] carbamazepine  (TEGRETOL ) 200 MG tablet Take 1-2 tablets by mouth 2 (two) times daily. 1 tab daily, 2 tabs at bedtime (Patient not taking: Reported on 03/23/2024)   [DISCONTINUED] cholecalciferol   (VITAMIN D) 1000 UNITS tablet Take 1,000 Units by mouth daily.     [DISCONTINUED] Cholecalciferol  50 MCG (2000 UT) TABS Take 50 mcg by mouth. (Patient not taking: Reported on 03/23/2024)   [DISCONTINUED] fluconazole  (DIFLUCAN ) 150 MG tablet Take 1 tablet (150 mg total) by mouth once. May repeat in 3 days if still having symptoms   [DISCONTINUED] glipiZIDE  (GLUCOTROL  XL) 2.5 MG 24 hr tablet Take 1 tablet by mouth daily. (Patient not taking: Reported on 03/23/2024)   [DISCONTINUED] JARDIANCE 25 MG TABS tablet Take 25 mg by mouth daily. (Patient not taking: Reported on 03/23/2024)   [DISCONTINUED] losartan  (COZAAR ) 25 MG tablet Take 25 mg by mouth daily.     [DISCONTINUED] nitrofurantoin , macrocrystal-monohydrate, (MACROBID ) 100 MG capsule Take 1 capsule (100 mg total) by mouth 2 (two) times daily.   [DISCONTINUED] oxyCODONE -acetaminophen  (PERCOCET/ROXICET) 5-325 MG tablet Take 1-2 tablets by mouth every 6 (six) hours as needed for moderate pain.   [DISCONTINUED] pioglitazone  (ACTOS ) 30 MG tablet Take 30 mg by mouth daily.  (Patient not taking: Reported on 03/23/2024)   [DISCONTINUED] predniSONE  (STERAPRED UNI-PAK 21 TAB) 10 MG (21) TBPK tablet Take as directed on packaging   [DISCONTINUED] senna-docusate (SENOKOT-S) 8.6-50 MG tablet Take 1 tablet by mouth 2 (two) times daily as needed. (Patient not taking:  Reported on 03/23/2024)   [DISCONTINUED] temazepam  (RESTORIL ) 30 MG capsule Take 30 mg by mouth at bedtime as needed for sleep.   [DISCONTINUED] traMADol  (ULTRAM ) 50 MG tablet Take 1 tablet (50 mg total) by mouth every 8 (eight) hours as needed.   No facility-administered encounter medications on file as of 03/23/2024.    Past Medical History:  Diagnosis Date   Arthritis    Asthma    Complication of anesthesia    reports that sometimes she is hard to wake up other time she is not.  2015 Panic attack and continued to have them for 6 weeks post  op.   Depression    "husband died Jun 16, 2015"   GERD  (gastroesophageal reflux disease)    intermittent   History of kidney stones    Hyperlipidemia    Hypertension    Panic attacks 2015   awaking from anesthesia and for 6 weeks after.  Ocassionaly feels painc   Shortness of breath dyspnea    with activity- "tying shoes'  X"Due to weight gain"   Trigeminal neuralgia    Type II diabetes mellitus (HCC)     Past Surgical History:  Procedure Laterality Date   APPENDECTOMY     BACK SURGERY     CESAREAN SECTION     CHOLECYSTECTOMY     CYSTOSCOPY W/ STONE MANIPULATION     LUMBAR LAMINECTOMY/DECOMPRESSION MICRODISCECTOMY N/A 07/11/2014   Procedure: MICRODISCECTOMY LUMBAR LAMINECTOMY -L3-L4 BILATERAL MICRODISCECTOMY ;  Surgeon: Adah Acron, MD;  Location: [facility] OR;  Service: Orthopedics;  Laterality: N/A;   LUMBAR LAMINECTOMY/DECOMPRESSION MICRODISCECTOMY N/A 02/09/2016   Procedure: Right 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;  Surgeon: Adah Acron, MD;  Location: [facility] OR;  Service: Orthopedics;  Laterality: N/A;   TOTAL ABDOMINAL HYSTERECTOMY     TUBAL LIGATION      Family History  Problem Relation Age of Onset   Hypertension Mother    Heart attack Mother    Stroke Father    Kidney disease Father    Cancer Sister        breast- in 79's   Diabetes Other        family hx of   Hypertension Other        family hx of   Cancer Other        prostate/ family hx of   Cancer Sister 74       colon   Stroke Other    Heart attack Other     Social History   Socioeconomic History   Marital status: Widowed    Spouse name: Not on file   Number of children: Not on file   Years of education: Not on file   Highest education level: Not on file  Occupational History   Not on file  Tobacco Use   Smoking status: Former    Types: Cigarettes   Smokeless tobacco: Never   Tobacco comments:    smoked as a teenager  Substance and Sexual Activity   Alcohol use: No   Drug use: No   Sexual activity: Never  Other Topics  Concern   Not on file  Social History Narrative   Not on file   Social Drivers of Health   Financial Resource Strain: Not on file  Food Insecurity: Not on file  Transportation Needs: Not on file  Physical Activity: Not on file  Stress: Not on file  Social Connections: Not on file  Intimate Partner Violence: Not At Risk (  01/15/2024)   Received from [facility]   HITS    Over the last 12 months how often did your partner physically hurt you?: Never    Over the last 12 months how often did your partner insult you or talk down to you?: Never    Over the last 12 months how often did your partner threaten you with physical harm?: Never    Over the last 12 months how often did your partner scream or curse at you?: Never    Review of Systems  Neurological:  Positive for focal weakness and weakness.  Psychiatric/Behavioral:  Positive for memory loss.   All other systems reviewed and are negative.      Objective    BP 123/74   Pulse 79   Temp 98.2 F (36.8 C) (Oral)   Resp 18   Ht 5\' 4"  (1.626 m)   Wt (!) 325 lb (147.4 kg)   SpO2 98%   BMI 55.79 kg/m   Physical Exam Vitals and nursing note reviewed.  Constitutional:      Appearance: Normal appearance. She is obese.  HENT:     Head: Normocephalic and atraumatic.     Right Ear: External ear normal.     Left Ear: External ear normal.     Nose: Nose normal.     Mouth/Throat:     Mouth: Mucous membranes are moist.     Pharynx: Oropharynx is clear.  Eyes:     Conjunctiva/sclera: Conjunctivae normal.     Pupils: Pupils are equal, round, and reactive to light.  Cardiovascular:     Rate and Rhythm: Normal rate and regular rhythm.     Pulses: Normal pulses.     Heart sounds: Normal heart sounds.  Pulmonary:     Effort: Pulmonary effort is normal.     Breath sounds: Normal breath sounds.  Skin:    General: Skin is warm.     Capillary Refill: Capillary refill takes less than 2 seconds.  Neurological:     General: No  focal deficit present.     Mental Status: She is alert and oriented to person, place, and time. Mental status is at baseline.     Motor: Weakness present.     Comments: Pt in wheelchair with physical debility requiring assistive device. In wheelchair today  Psychiatric:        Mood and Affect: Mood normal.        Behavior: Behavior normal.        Thought Content: Thought content normal.        Judgment: Judgment normal.      Assessment & Plan:   Problem List Items Addressed This Visit   None Encounter to establish care with new doctor  Urinary incontinence, unspecified type -     Myrbetriq; Take 1 tablet (50 mg total) by mouth daily.  Dispense: 90 tablet; Refill: 0  Depression with anxiety -     Sertraline HCl; Take 1 tablet (25 mg total) by mouth daily.  Dispense: 90 tablet; Refill: 0  History of fall -     Ambulatory referral to Home Health  Physical debility -     Ambulatory referral to Home Health  Mild dementia, unspecified dementia type, unspecified whether behavioral, psychotic, or mood disturbance or anxiety (HCC) -     Ambulatory referral to Home Health   Pt with hx of urinary incontinence stable and controlled. Needs Myrbetriq 50mg  daily refilled. Pt with hx of depression/anxiety. Stable and controlled. Needs  Sertraline 25mg  daily refilled. To refer for PT with Home health due to dementia, need for assistance with AdLs, and physical debility.   See back in 4 weeks.   No follow-ups on file.   Manette Section, MD

## 2024-04-20 ENCOUNTER — Ambulatory Visit: Admitting: Family Medicine

## 2024-04-20 ENCOUNTER — Encounter: Payer: Self-pay | Admitting: Family Medicine

## 2024-04-20 VITALS — BP 141/79 | HR 74 | Temp 98.2°F | Resp 18 | Ht >= 80 in | Wt 325.0 lb

## 2024-04-20 DIAGNOSIS — K5900 Constipation, unspecified: Secondary | ICD-10-CM | POA: Insufficient documentation

## 2024-04-20 DIAGNOSIS — J4520 Mild intermittent asthma, uncomplicated: Secondary | ICD-10-CM | POA: Insufficient documentation

## 2024-04-20 DIAGNOSIS — R011 Cardiac murmur, unspecified: Secondary | ICD-10-CM | POA: Insufficient documentation

## 2024-04-20 DIAGNOSIS — E782 Mixed hyperlipidemia: Secondary | ICD-10-CM | POA: Diagnosis not present

## 2024-04-20 DIAGNOSIS — F5101 Primary insomnia: Secondary | ICD-10-CM | POA: Insufficient documentation

## 2024-04-20 DIAGNOSIS — E1169 Type 2 diabetes mellitus with other specified complication: Secondary | ICD-10-CM

## 2024-04-20 DIAGNOSIS — F3341 Major depressive disorder, recurrent, in partial remission: Secondary | ICD-10-CM | POA: Insufficient documentation

## 2024-04-20 DIAGNOSIS — N3281 Overactive bladder: Secondary | ICD-10-CM | POA: Insufficient documentation

## 2024-04-20 LAB — POCT GLYCOSYLATED HEMOGLOBIN (HGB A1C): Hemoglobin A1C: 6.4 % — AB (ref 4.0–5.6)

## 2024-04-20 MED ORDER — ATORVASTATIN CALCIUM 40 MG PO TABS: 40.0000 mg | ORAL_TABLET | Freq: Every day | ORAL | 1 refills | Status: DC

## 2024-04-20 NOTE — Progress Notes (Signed)
 Established Patient Office Visit  Subjective   Patient ID: Traci Sullivan, female    DOB: 11/25/1945  Age: 78 y.o. MRN: [id number]  Chief Complaint  Patient presents with   Follow-up    Patient is here for a follow up for DM , and to have HGA1c done, Patient is also needing refills on some medications primarily (Atorvastatin )  40 mg     HPI  Diabetes Pt taking Metformin 1000mg  BID. Taking daily. Checking sugars daily running 178-200. Also using Atorvastatin  40mg  daily for HLD. Needs this refilled. Haven't had lipid panel done in quite some time.     Review of Systems  All other systems reviewed and are negative.     Objective:      BP (!) 141/79   Pulse 74   Temp 98.2 F (36.8 C) (Oral)   Resp 18   Ht (!) 9' (2.743 m)   Wt (!) 325 lb (147.4 kg)   SpO2 99%   BMI 19.59 kg/m  BP Readings from Last 3 Encounters:  04/20/24 (!) 141/79  03/23/24 123/74  09/28/21 127/78      Physical Exam Vitals and nursing note reviewed.  Constitutional:      Appearance: Normal appearance. She is normal weight.  HENT:     Head: Normocephalic and atraumatic.     Right Ear: External ear normal.     Left Ear: External ear normal.     Nose: Nose normal.     Mouth/Throat:     Mouth: Mucous membranes are moist.     Pharynx: Oropharynx is clear.  Eyes:     Conjunctiva/sclera: Conjunctivae normal.     Pupils: Pupils are equal, round, and reactive to light.  Cardiovascular:     Rate and Rhythm: Normal rate.  Pulmonary:     Effort: Pulmonary effort is normal.  Skin:    General: Skin is warm.     Capillary Refill: Capillary refill takes less than 2 seconds.  Neurological:     General: No focal deficit present.     Mental Status: She is alert and oriented to person, place, and time. Mental status is at baseline.  Psychiatric:        Mood and Affect: Mood normal.        Behavior: Behavior normal.        Thought Content: Thought content normal.        Judgment: Judgment normal.      Results for orders placed or performed in visit on 04/20/24  POCT glycosylated hemoglobin (Hb A1C)  Result Value Ref Range   Hemoglobin A1C 6.4 (A) 4.0 - 5.6 %   HbA1c POC (<> result, manual entry)     HbA1c, POC (prediabetic range)     HbA1c, POC (controlled diabetic range)      Last hemoglobin A1c Lab Results  Component Value Date   HGBA1C 6.4 (A) 04/20/2024      The ASCVD Risk score (Arnett DK, et al., 2019) failed to calculate for the following reasons:   Cannot find a previous HDL lab   Cannot find a previous total cholesterol lab    Assessment & Plan:   Problem List Items Addressed This Visit       Endocrine   Type 2 diabetes mellitus with other specified complication (HCC) - Primary   Relevant Medications   atorvastatin  (LIPITOR) 40 MG tablet   Other Relevant Orders   POCT glycosylated hemoglobin (Hb A1C) (Completed)     Other  Mixed hyperlipidemia   Relevant Medications   atorvastatin  (LIPITOR) 40 MG tablet   Other Relevant Orders   Lipid panel   Pt with diabetes, stable and much better control. Continue Metformin 1000mg  BID for now. Refilled Atorvastatin  40mg  daily. Recheck lipid panel today.   Return in about 3 months (around 07/21/2024) for Diabetes.    Manette Section, MD

## 2024-04-21 ENCOUNTER — Ambulatory Visit: Payer: Self-pay | Admitting: Family Medicine

## 2024-04-21 DIAGNOSIS — E782 Mixed hyperlipidemia: Secondary | ICD-10-CM

## 2024-04-21 DIAGNOSIS — E1169 Type 2 diabetes mellitus with other specified complication: Secondary | ICD-10-CM

## 2024-04-21 LAB — LIPID PANEL
Chol/HDL Ratio: 3 ratio (ref 0.0–4.4)
Cholesterol, Total: 147 mg/dL (ref 100–199)
HDL: 49 mg/dL (ref >39)
LDL Chol Calc (NIH): 70 mg/dL (ref 0–99)
Triglycerides: 163 mg/dL — ABNORMAL HIGH (ref 0–149)
VLDL Cholesterol Cal: 28 mg/dL (ref 5–40)

## 2024-04-21 MED ORDER — ATORVASTATIN CALCIUM 40 MG PO TABS: 20.0000 mg | ORAL_TABLET | Freq: Every day | ORAL | 1 refills | Status: DC

## 2024-05-08 ENCOUNTER — Other Ambulatory Visit: Payer: Self-pay | Admitting: Family Medicine

## 2024-06-17 ENCOUNTER — Other Ambulatory Visit: Payer: Self-pay | Admitting: Family Medicine

## 2024-06-17 DIAGNOSIS — F418 Other specified anxiety disorders: Secondary | ICD-10-CM

## 2024-06-28 ENCOUNTER — Telehealth: Payer: Self-pay

## 2024-06-28 DIAGNOSIS — R32 Unspecified urinary incontinence: Secondary | ICD-10-CM

## 2024-06-28 DIAGNOSIS — I10 Essential (primary) hypertension: Secondary | ICD-10-CM

## 2024-06-28 MED ORDER — LOSARTAN POTASSIUM 50 MG PO TABS: 50.0000 mg | ORAL_TABLET | Freq: Every day | ORAL | 1 refills | Status: AC

## 2024-06-28 MED ORDER — MYRBETRIQ 50 MG PO TB24: 50.0000 mg | ORAL_TABLET | Freq: Every day | ORAL | 1 refills | Status: AC

## 2024-06-28 NOTE — Telephone Encounter (Signed)
 Patient daughter came in to see if we could send over a Rx for the following:     MYRBETRIQ  50 MG TB24 tablet   losartan  (COZAAR ) 50 MG tablet   The Pharmacy is correct [facility]

## 2024-06-28 NOTE — Telephone Encounter (Signed)
 Requested rx's sent to [facility] in [place].

## 2024-06-28 NOTE — Telephone Encounter (Signed)
 I called to let the patients daughter know that the Rx's were sent.

## 2024-07-23 ENCOUNTER — Ambulatory Visit: Admitting: Family Medicine

## 2024-07-23 ENCOUNTER — Encounter: Payer: Self-pay | Admitting: Family Medicine

## 2024-07-23 VITALS — BP 129/79 | HR 69 | Temp 97.9°F | Resp 18 | Ht 60.0 in

## 2024-07-23 DIAGNOSIS — E1169 Type 2 diabetes mellitus with other specified complication: Secondary | ICD-10-CM | POA: Diagnosis not present

## 2024-07-23 LAB — POCT GLYCOSYLATED HEMOGLOBIN (HGB A1C): Hemoglobin A1C: 7 % — AB (ref 4.0–5.6)

## 2024-07-23 MED ORDER — METFORMIN HCL 1000 MG PO TABS: ORAL_TABLET | ORAL | Status: DC

## 2024-07-23 NOTE — Progress Notes (Signed)
 Established Patient Office Visit  Subjective   Patient ID: Traci Sullivan, female    DOB: 01-18-1946  Age: 78 y.o. MRN: [id number]  Chief Complaint  Patient presents with   Diabetes    Diabetes    Diabetes Pt is taking Metformin  1000mg  1/2 tab po BID. A1c increased to 7.0 since last visit of 6.4. Checking sugars sporadically and been running 180s. Pt denies hypoglycemia episodes.     Review of Systems  All other systems reviewed and are negative.     Objective:     BP 129/79   Pulse 69   Temp 97.9 F (36.6 C) (Oral)   Resp 18   Ht 5' (1.524 m)   SpO2 98%   BMI 63.47 kg/m  BP Readings from Last 3 Encounters:  07/23/24 129/79  04/20/24 (!) 141/79  03/23/24 123/74      Physical Exam Vitals and nursing note reviewed.  Constitutional:      Appearance: Normal appearance. She is obese.  HENT:     Head: Normocephalic and atraumatic.     Right Ear: External ear normal.     Left Ear: External ear normal.     Nose: Nose normal.     Mouth/Throat:     Mouth: Mucous membranes are moist.     Pharynx: Oropharynx is clear.  Eyes:     Conjunctiva/sclera: Conjunctivae normal.     Pupils: Pupils are equal, round, and reactive to light.  Cardiovascular:     Rate and Rhythm: Normal rate.  Pulmonary:     Effort: Pulmonary effort is normal.  Skin:    General: Skin is warm.     Capillary Refill: Capillary refill takes less than 2 seconds.  Neurological:     General: No focal deficit present.     Mental Status: She is alert and oriented to person, place, and time. Mental status is at baseline.  Psychiatric:        Mood and Affect: Mood normal.        Behavior: Behavior normal.        Thought Content: Thought content normal.        Judgment: Judgment normal.      Results for orders placed or performed in visit on 07/23/24  POCT glycosylated hemoglobin (Hb A1C)  Result Value Ref Range   Hemoglobin A1C 7.0 (A) 4.0 - 5.6 %   HbA1c POC (<> result, manual entry)      HbA1c, POC (prediabetic range)     HbA1c, POC (controlled diabetic range)         The 10-year ASCVD risk score (Arnett DK, et al., 2019) is: 47.7%    Assessment & Plan:   Problem List Items Addressed This Visit       Endocrine   Type 2 diabetes mellitus with other specified complication (HCC) - Primary   Relevant Medications   metFORMIN  (GLUCOPHAGE ) 1000 MG tablet   Other Relevant Orders   POCT glycosylated hemoglobin (Hb A1C) (Completed)   Type 2 diabetes mellitus with other specified complication, without long-term current use of insulin  (HCC) -     POCT glycosylated hemoglobin (Hb A1C) -     metFORMIN  HCl; Take 1 tab po daily and 1/2 tab po at night    Lab Results  Component Value Date   HGBA1C 7.0 (A) 07/23/2024   A1c up a little. To increase Metformin  from 1000mg  1/2 tab po bid to 1 tab po in the a.m and 1/2 tab po nightly.  Return in about 6 months (around 01/20/2025) for Chronic condition follow up.    Torrence CINDERELLA Barrier, MD

## 2024-08-26 ENCOUNTER — Other Ambulatory Visit: Payer: Self-pay | Admitting: Family Medicine

## 2024-08-26 DIAGNOSIS — E1169 Type 2 diabetes mellitus with other specified complication: Secondary | ICD-10-CM

## 2024-08-26 MED ORDER — METFORMIN HCL 1000 MG PO TABS: ORAL_TABLET | ORAL | 0 refills | Status: AC

## 2024-08-26 NOTE — Progress Notes (Signed)
 Refill request per pt daughter for metformin . # [phone] sent to pharmacy. Follow-up with PCP in 3 months.

## 2024-09-17 ENCOUNTER — Other Ambulatory Visit: Payer: Self-pay | Admitting: Family Medicine

## 2024-09-17 DIAGNOSIS — F418 Other specified anxiety disorders: Secondary | ICD-10-CM

## 2024-10-04 ENCOUNTER — Other Ambulatory Visit: Payer: Self-pay | Admitting: Family Medicine

## 2024-10-04 DIAGNOSIS — E1169 Type 2 diabetes mellitus with other specified complication: Secondary | ICD-10-CM

## 2024-10-04 NOTE — Telephone Encounter (Signed)
 Copied from CRM #[id number]. Topic: Clinical - Medication Refill >> Oct 04, 2024 11:56 AM Willma R wrote: Medication: metFORMIN  (GLUCOPHAGE ) 1000 MG tablet (Patient was only given a 60 pills and instructions on bottle say to take twice daily)  Has the patient contacted their pharmacy? Yes, call dr  This is the patient's preferred pharmacy:  [id number] - [place], [place] - [address] AT [facility] [address] [place] [address] Phone: [phone] Fax: [phone]  Is this the correct pharmacy for this prescription? Yes If no, delete pharmacy and type the correct one.   Has the prescription been filled recently? No  Is the patient out of the medication? No  Has the patient been seen for an appointment in the last year OR does the patient have an upcoming appointment? Yes  Can we respond through MyChart? No  Agent: Please be advised that Rx refills may take up to 3 business days. We ask that you follow-up with your pharmacy.

## 2024-10-05 ENCOUNTER — Other Ambulatory Visit: Payer: Self-pay | Admitting: Family Medicine

## 2024-10-05 DIAGNOSIS — R32 Unspecified urinary incontinence: Secondary | ICD-10-CM

## 2024-10-15 ENCOUNTER — Other Ambulatory Visit: Payer: Self-pay | Admitting: Family Medicine

## 2024-10-15 DIAGNOSIS — E1169 Type 2 diabetes mellitus with other specified complication: Secondary | ICD-10-CM

## 2024-10-15 DIAGNOSIS — E782 Mixed hyperlipidemia: Secondary | ICD-10-CM

## 2024-11-24 ENCOUNTER — Telehealth: Payer: Self-pay

## 2024-11-24 NOTE — Progress Notes (Signed)
 Pharmacy Quality Measure Review   This patient is appearing on a report for being at risk of failing the adherence measure for diabetes (metformin ) medications this calendar year.   Medication: Metformin  1000mg  tablet Last fill date: 10/06/2024 for a 90 day supply   Patient is currently adherent. Next refill will be due on 01/04/25. No intervention needed at this time   Quoc Tome Student-PharmD

## 2024-12-02 ENCOUNTER — Telehealth: Payer: Self-pay

## 2024-12-02 NOTE — Telephone Encounter (Signed)
 Attempted to contact patients daughter, Tisha Zawatski, but unfortunately was sent to VM. Per DPR, I am able to leave a detailed voice message for Ms. Zawatski. I stated, according to our records there should be one refill left with pt's pharmacy for both medications. I advised they reach out to [facility] pharmacy for further clarification and to contact our office with any further questions or concerns.

## 2024-12-02 NOTE — Telephone Encounter (Signed)
 Copied from CRM #[id number]. Topic: Clinical - Medication Question >> Dec 02, 2024 11:21 AM [name] wrote: Reason for CRM: patients daughter faxed a refill request over for these medications. Calling for a update. Please advise. Traci Sullivan- [phone]  losartan  (COZAAR ) 50 MG tablet [[id number]] atorvastatin  (LIPITOR) 40 MG tablet [[id number]]

## 2025-01-21 ENCOUNTER — Ambulatory Visit: Admitting: Family Medicine
